# Patient Record
Sex: Male | Born: 1980 | Race: White | Hispanic: No | Marital: Single | Smoking: Current every day smoker
Health system: Southern US, Community
[De-identification: ages and names within clinical notes are randomized; demographics above are authoritative.]

## PROBLEM LIST (undated history)

## (undated) DIAGNOSIS — E7800 Pure hypercholesterolemia, unspecified: Secondary | ICD-10-CM

## (undated) DIAGNOSIS — G43909 Migraine, unspecified, not intractable, without status migrainosus: Secondary | ICD-10-CM

## (undated) DIAGNOSIS — K3184 Gastroparesis: Secondary | ICD-10-CM

## (undated) DIAGNOSIS — N186 End stage renal disease: Secondary | ICD-10-CM

## (undated) DIAGNOSIS — G629 Polyneuropathy, unspecified: Secondary | ICD-10-CM

## (undated) DIAGNOSIS — I10 Essential (primary) hypertension: Secondary | ICD-10-CM

## (undated) DIAGNOSIS — D649 Anemia, unspecified: Secondary | ICD-10-CM

## (undated) HISTORY — PX: CHOLECYSTECTOMY: SHX55

## (undated) HISTORY — PX: AV FISTULA PLACEMENT: SHX1204

---

## 2014-12-01 ENCOUNTER — Encounter (HOSPITAL_BASED_OUTPATIENT_CLINIC_OR_DEPARTMENT_OTHER): Payer: Self-pay | Admitting: *Deleted

## 2014-12-01 ENCOUNTER — Emergency Department (HOSPITAL_BASED_OUTPATIENT_CLINIC_OR_DEPARTMENT_OTHER)
Admission: EM | Admit: 2014-12-01 | Discharge: 2014-12-01 | Discharge status: Against Medical Advice | Payer: Self-pay | Attending: Emergency Medicine | Admitting: Emergency Medicine

## 2014-12-01 ENCOUNTER — Other Ambulatory Visit: Payer: Self-pay

## 2014-12-01 DIAGNOSIS — Z720 Tobacco use: Secondary | ICD-10-CM | POA: Insufficient documentation

## 2014-12-01 DIAGNOSIS — I10 Essential (primary) hypertension: Secondary | ICD-10-CM | POA: Insufficient documentation

## 2014-12-01 HISTORY — DX: Essential (primary) hypertension: I10

## 2014-12-01 LAB — BASIC METABOLIC PANEL
Anion gap: 8 (ref 5–15)
BUN: 26 mg/dL — AB (ref 6–23)
CALCIUM: 8.3 mg/dL — AB (ref 8.4–10.5)
CHLORIDE: 105 mmol/L (ref 96–112)
CO2: 24 mmol/L (ref 19–32)
Creatinine, Ser: 2.78 mg/dL — ABNORMAL HIGH (ref 0.50–1.35)
GFR calc Af Amer: 32 mL/min — ABNORMAL LOW (ref >90)
GFR calc non Af Amer: 28 mL/min — ABNORMAL LOW (ref >90)
Glucose, Bld: 116 mg/dL — ABNORMAL HIGH (ref 70–99)
Potassium: 3.5 mmol/L (ref 3.5–5.1)
SODIUM: 137 mmol/L (ref 135–145)

## 2014-12-01 NOTE — ED Notes (Signed)
States ran out of BP meds 1 month ago. Now states BP has been elevated x 2 weeks. C/o h/a x 2 weeks.No vision problems.

## 2014-12-01 NOTE — ED Notes (Signed)
Patient became very angry when MD entered room about law draw and request for urinalysis. Patient yelling and screaming at staff and [employer] at bedside with security. Spouse in tears and rationale for testing explained to spouse but patient to angry to communicate with about rationale. Patient escorted off property by police after IV removed and cardiac monitor

## 2014-12-01 NOTE — ED Provider Notes (Signed)
CSN: [id number]     Arrival date & time 12/01/14  1521 History   First MD Initiated Contact with Patient 12/01/14 1618     Chief Complaint  Patient presents with  . Hypertension     (Consider location/radiation/quality/duration/timing/severity/associated sxs/prior Treatment) HPI  Past Medical History  Diagnosis Date  . Hypertension    History reviewed. No pertinent past surgical history. No family history on file. History  Substance Use Topics  . Smoking status: Current Every Day Smoker -- 0.50 packs/day    Types: Cigarettes  . Smokeless tobacco: Not on file  . Alcohol Use: Not on file    Review of Systems    Allergies  Sulfa antibiotics  Home Medications   Prior to Admission medications   Not on File   BP 215/148 mmHg  Pulse 95  Temp(Src) 98.6 F (37 C) (Oral)  Resp 16  Ht 6\' 1"  (1.854 m)  Wt 200 lb (90.719 kg)  BMI 26.39 kg/m2  SpO2 100% Physical Exam  ED Course  Procedures (including critical care time) Labs Review Labs Reviewed  BASIC METABOLIC PANEL    Imaging Review No results found.   EKG Interpretation   Date/Time:  Saturday December 01 2014 15:37:17 EDT Ventricular Rate:  96 PR Interval:  160 QRS Duration: 88 QT Interval:  350 QTC Calculation: 442 R Axis:   52 Text Interpretation:  Normal sinus rhythm Possible Left atrial enlargement  Borderline ECG No old tracing to compare Confirmed by [facility]  MD, Kindred Reidinger  [phone]) on 12/01/2014 4:19:23 PM      MDM   Final diagnoses:  None    Went into room to evaluate patient, as I introduced myself he immediately began shouting and aggressively yelling that he "didn't come here to get poked and prodded"  He was upset about being asked to give urine sample and having IV placed.  He was aggressive and belligerent, would not stop yelling after attempts at verbal redirection.  Woman in room with him began crying.  Security was called as patient seemed violent in his anger.  Pt demanding to have IV  taken out and to leave.  Attempted to let him know that we were checking his bloodwork and urine for kidney damage.  Security escorted patient out of the ED due to his continued yelling and aggressive behavior.     Alfonzo Beers, MD 12/01/14 [phone]

## 2015-02-06 ENCOUNTER — Emergency Department (HOSPITAL_COMMUNITY)
Admission: EM | Admit: 2015-02-06 | Discharge: 2015-02-06 | Disposition: A | Discharge status: Home / Self Care | Payer: Self-pay | Attending: Emergency Medicine | Admitting: Emergency Medicine

## 2015-02-06 ENCOUNTER — Encounter (HOSPITAL_COMMUNITY): Payer: Self-pay

## 2015-02-06 ENCOUNTER — Telehealth: Payer: Self-pay

## 2015-02-06 DIAGNOSIS — R000 Tachycardia, unspecified: Secondary | ICD-10-CM | POA: Insufficient documentation

## 2015-02-06 DIAGNOSIS — R519 Headache, unspecified: Secondary | ICD-10-CM

## 2015-02-06 DIAGNOSIS — R51 Headache: Secondary | ICD-10-CM | POA: Insufficient documentation

## 2015-02-06 DIAGNOSIS — N179 Acute kidney failure, unspecified: Secondary | ICD-10-CM | POA: Insufficient documentation

## 2015-02-06 DIAGNOSIS — I10 Essential (primary) hypertension: Secondary | ICD-10-CM | POA: Insufficient documentation

## 2015-02-06 DIAGNOSIS — Z720 Tobacco use: Secondary | ICD-10-CM | POA: Insufficient documentation

## 2015-02-06 LAB — BASIC METABOLIC PANEL
Anion gap: 10 (ref 5–15)
BUN: 39 mg/dL — ABNORMAL HIGH (ref 6–20)
CO2: 23 mmol/L (ref 22–32)
Calcium: 8.9 mg/dL (ref 8.9–10.3)
Chloride: 106 mmol/L (ref 101–111)
Creatinine, Ser: 3.16 mg/dL — ABNORMAL HIGH (ref 0.61–1.24)
GFR calc Af Amer: 28 mL/min — ABNORMAL LOW (ref >60)
GFR calc non Af Amer: 24 mL/min — ABNORMAL LOW (ref >60)
Glucose, Bld: 114 mg/dL — ABNORMAL HIGH (ref 65–99)
Potassium: 3.5 mmol/L (ref 3.5–5.1)
Sodium: 139 mmol/L (ref 135–145)

## 2015-02-06 MED ORDER — ATENOLOL 50 MG PO TABS
50.0000 mg | ORAL_TABLET | Freq: Once | ORAL | Status: AC
  Administered 2015-02-06: 50 mg via ORAL
  Filled 2015-02-06: qty 1

## 2015-02-06 MED ORDER — ATENOLOL 50 MG PO TABS: 50.0000 mg | ORAL_TABLET | Freq: Every day | ORAL | Status: DC

## 2015-02-06 NOTE — ED Notes (Signed)
Patient reports that he has been having headaches x several weeks, but today's headache started at 0600. Patient states that he has been taking Atenolol, but states he has not taking the med since December 2015. Patient states slight blurred vision, sensitivity to light, N/V x 2 days.

## 2015-02-06 NOTE — ED Provider Notes (Signed)
CSN: [id number]     Arrival date & time 02/06/15  0718 History   First MD Initiated Contact with Patient 02/06/15 0732     Chief Complaint  Patient presents with  . Hypertension   HPI   34 year old male presents today with hypertension. Patient reports a significant past medical history there was diagnosed 2009, prescribed atenolol 50 mg. Patient reports he's been taking that since time of diagnosis up until December 2015. Patient reports that he was not monitoring his blood pressure while taking the medication uncertain what his baseline is. Patient reports that he was previously seen and treated in the emergency room in [place] where he was able to receive his blood pressure medication without any laboratory work. Patient reports that he was seen here in April, but was discharged from the facility escorted out by security. Patient's concern today is elevated hypertension, headache, nausea and vomiting. Patient denies any focal neurological deficits including changes in vision, taste, smell, weaknesses. Patient notes his urine has been "orange recently, reports that he's been drinking plenty of water but not producing as much urine is normal. Patient denies any chest pain shortness of breath, abdominal pain, lower extremity swelling or edema. She has a history of chronic back pain, left shoulder pain which has left him with weakness in the right extremity. He denies any changes in his baseline strength in that extremity. Patient requests blood pressure medication without laboratory work today. Patient expresses his distaste for the [facility], reports that he wants his blood pressure medication without any laboratory results, and reports that he will return next month for the same. She reports that he does not have a primary care provider and does not want one.  Past Medical History  Diagnosis Date  . Hypertension    History reviewed. No pertinent past surgical history. Family History   Problem Relation Age of Onset  . Migraines Mother    History  Substance Use Topics  . Smoking status: Current Every Day Smoker -- 0.50 packs/day    Types: Cigarettes  . Smokeless tobacco: Current User  . Alcohol Use: No    Review of Systems  All other systems reviewed and are negative.    Allergies  Sulfa antibiotics  Home Medications   Prior to Admission medications   Medication Sig Start Date End Date Taking? Authorizing Provider  atenolol (TENORMIN) 50 MG tablet Take 1 tablet (50 mg total) by mouth daily. 02/06/15   Dellis Filbert Lacey Wallman, PA-C   BP 201/132 mmHg  Pulse 71  Temp(Src) 98.7 F (37.1 C) (Oral)  Resp 16  Ht 6' (1.829 m)  Wt 210 lb (95.255 kg)  BMI 28.47 kg/m2  SpO2 98% Physical Exam  Constitutional: He is oriented to person, place, and time. He appears well-developed and well-nourished.  HENT:  Head: Normocephalic and atraumatic.  Eyes: Conjunctivae and EOM are normal. Pupils are equal, round, and reactive to light. Right eye exhibits no discharge. Left eye exhibits no discharge. No scleral icterus. Right eye exhibits normal extraocular motion and no nystagmus. Left eye exhibits normal extraocular motion and no nystagmus. Right pupil is round and reactive. Left pupil is round and reactive. Pupils are equal.  Fundoscopic exam:      The right eye shows red reflex.       The left eye shows red reflex.  Funduscopic attempted difficult exam  Neck: Trachea normal and normal range of motion. Neck supple. No JVD present. Carotid bruit is not present. No tracheal deviation present.  Cardiovascular: Regular rhythm, S1 normal, S2 normal, normal heart sounds and normal pulses.  Tachycardia present.  PMI is not displaced.   No murmur heard. Pulses:      Radial pulses are 2+ on the right side, and 2+ on the left side.       Posterior tibial pulses are 2+ on the right side, and 2+ on the left side.  No lower extremity swelling or edema  Pulmonary/Chest: Effort normal and  breath sounds normal. No stridor. He has no decreased breath sounds. He has no wheezes. He has no rhonchi. He has no rales.  Abdominal: He exhibits no abdominal bruit. There is no tenderness. There is no rigidity, no rebound, no guarding, no CVA tenderness, no tenderness at McBurney's point and negative Murphy's sign.  Neurological: He is alert and oriented to person, place, and time. Coordination normal.  Psychiatric: He has a normal mood and affect. His behavior is normal. Judgment and thought content normal.  Nursing note and vitals reviewed.   ED Course  Procedures (including critical care time) Labs Review Labs Reviewed  BASIC METABOLIC PANEL - Abnormal; Notable for the following:    Glucose, Bld 114 (*)    BUN 39 (*)    Creatinine, Ser 3.16 (*)    GFR calc non Af Amer 24 (*)    GFR calc Af Amer 28 (*)    All other components within normal limits    Imaging Review No results found.   EKG Interpretation   Date/Time:  Wednesday February 06 2015 09:03:56 EDT Ventricular Rate:  82 PR Interval:  166 QRS Duration: 103 QT Interval:  379 QTC Calculation: 443 R Axis:   13 Text Interpretation:  Sinus rhythm Consider left atrial enlargement RSR'  in V1 or V2, probably normal variant Baseline wander in lead(s) II III aVR  aVF No significant change since last tracing Confirmed by [facility]  MD,  MARTHA [phone]) on 02/06/2015 9:56:03 AM      MDM   Final diagnoses:  Essential hypertension  AKI (acute kidney injury)  Headache, unspecified headache type   Labs: BMP- significant for creatine 3.16, BUN 39, glucose 114, GFR 24  Imaging: EKG  Consults: Case Management  Therapeutics: Atenolol   Assessment: hypertension, AKI  Although the patient received the appropriate care today I feel this patient needs to be handled with caution in the event he returns to the ED. His initial presentation was concerning and could potentially be unsafe for those taking care of him. Please request  security presence in the event he returns.   Plan: Pt presents with hypertension requesting medication. Before I was aware of the patient I heard screaming and yelling from a patients room while working in the provider lounge. I went to investigate it and found patient in exam bed yelling at staff, cursing and recording the event on his phone. His mother was at the door attempting to justify his behavior and asking for [place] to "just give him his medication", and " back home he never had to have labs". I instructed nursing staff to immediate leave the room as i was unsure of their safety at that time. Due to patients erradic behavior and elevated BP it was nesseccary for me to evaluate the patient to insure this was a behavior issue and not an underlying medical condition causing this. I reviewed patients chart and found his most recent visit to the ED was in 12/01/2014 and had an elevated creatinine of 2.78; he refused care,  was verbally abusive,  and had to e escorted out by security. Nursing staff member and I entered the room to find patient anxious in exam bed. He immediately began recording the conversation even though he was aware we requested he not. Pt was coherent but continued to be difficult to address as he voiced his concerns about "his federal rights" and previous ED experiences. I was able to complete an exam and explain the importance of labs today. I explained that his previous results were abnormal; specifically kidney function. He could possibly has serious damage to his kidneys and it was imperative we trend the function. He agreed to BMP only and EKG, no urine.   Pt reported headache initially but after he was able to calm down the headache was resolving and at the time of discharge was nearly absent. He had no neurological findings or red flags.   I had numerous discussions with patient, some were recorded and some not. After labs showed worsening kidney function I explained to the patient  potentially life threatenting and disabling consequences of inadequate proper follow-up and kidney function test, the need for adequate BP control, and further evaluation. Pt reports that he will just return to the emergency room and " you will give me my mediation", and "you cannot refuse me care".  I informed the patient that although we are here if he needs emergent care his overall care would be improved by a PCP that could adeqautely manage ongoing chronic conditions. I informed him that I would only write him a 30 day supply as I could not continually provide him BP medication without evaluation and neccessary studies. Only after lengthy discussion was patient agreeable to to following up with a PCP for his hypertension. I informed him of serious signs and symptoms to monitor for and to return to the ED if they presents. Pt was satisfied with today's plan and the care he received, thanked [place] for taking care of him, and was discharged with adequate follow-up resources.       Okey Regal, PA-C 02/08/15 [place], MD 02/12/15 [phone]

## 2015-02-06 NOTE — ED Notes (Signed)
Pt refused UA, PA aware

## 2015-02-06 NOTE — Discharge Instructions (Signed)
Acute Kidney Injury Acute kidney injury is a disease in which there is sudden (acute) damage to the kidneys. The kidneys are 2 organs that lie on either side of the spine between the middle of the back and the front of the abdomen. The kidneys:  Remove wastes and extra water from the blood.   Produce important hormones. These help keep bones strong, regulate blood pressure, and help create red blood cells.   Balance the fluids and chemicals in the blood and tissues. A small amount of kidney damage may not cause problems, but a large amount of damage may make it difficult or impossible for the kidneys to work the way they should. Acute kidney injury may develop into long-lasting (chronic) kidney disease. It may also develop into a life-threatening disease called end-stage kidney disease. Acute kidney injury can get worse very quickly, so it should be treated right away. Early treatment may prevent other kidney diseases from developing.  CAUSES   A problem with blood flow to the kidneys. This may be caused by:   Blood loss.   Heart disease.   Severe burns.   Liver disease.  Direct damage to the kidneys. This may be caused by:  Some medicines.   A kidney infection.   Poisoning or consuming toxic substances.   A surgical wound.   A blow to the kidney area.   A problem with urine flow. This may be caused by:   Cancer.   Kidney stones.   An enlarged prostate. SYMPTOMS   Swelling (edema) of the legs, ankles, or feet.   Tiredness (lethargy).   Nausea or vomiting.   Confusion.   Problems with urination, such as:   Painful or burning feeling during urination.   Decreased urine production.   Frequent accidents in children who are potty trained.   Bloody urine.   Muscle twitches and cramps.   Shortness of breath.   Seizures.   Chest pain or pressure. Sometimes, no symptoms are present. DIAGNOSIS Acute kidney injury may be detected  and diagnosed by tests, including blood, urine, imaging, or kidney biopsy tests.  TREATMENT Treatment of acute kidney injury varies depending on the cause and severity of the kidney damage. In mild cases, no treatment may be needed. The kidneys may heal on their own. If acute kidney injury is more severe, your caregiver will treat the cause of the kidney damage, help the kidneys heal, and prevent complications from occurring. Severe cases may require a procedure to remove toxic wastes from the body (dialysis) or surgery to repair kidney damage. Surgery may involve:   Repair of a torn kidney.   Removal of an obstruction. Most of the time, you will need to stay overnight at the hospital.  HOME CARE INSTRUCTIONS:  Follow your prescribed diet.  Only take over-the-counter or prescription medicines as directed by your caregiver.  Do not take any new medicines (prescription, over-the-counter, or nutritional supplements) unless approved by your caregiver. Many medicines can worsen your kidney damage or need to have the dose adjusted.   Keep all follow-up appointments as directed by your caregiver.  Observe your condition to make sure you are healing as expected. SEEK IMMEDIATE MEDICAL CARE IF:  You are feeling ill or have severe pain in the back or side.   Your symptoms return or you have new symptoms.  You have any symptoms of end-stage kidney disease. These include:   Persistent itchiness.   Loss of appetite.   Headaches.   Abnormally dark  or light skin.  Numbness in the hands or feet.   Easy bruising.   Frequent hiccups.   Menstruation stops.   You have a fever.  You have increased urine production.  You have pain or bleeding when urinating. MAKE SURE YOU:   Understand these instructions.  Will watch your condition.  Will get help right away if you are not doing well or get worse Document Released: 02/16/2011 Document Revised: 11/28/2012 Document  Reviewed: 04/01/2012 [facility] Patient Information 2015 [place], [place]. This information is not intended to replace advice given to you by your health care provider. Make sure you discuss any questions you have with your health care provider.  Please read attached information. Please follow-up with resources provided for insurance and primary care provider. It's imperative that you follow up with them within the next week for reevaluation of her hypertension and acute kidney injury. Not following up for potentially cause life-threatening condition, or disability. If new or worsening signs or symptoms present please return to the emergency room for further evaluation and management. Please use medication only as directed.

## 2015-02-06 NOTE — ED Notes (Signed)
Patient was at the registration area yelling at the registration person. Writer went out and asked patient to come on in to the ED. Writer was attempting to escort patient to the room. Patient ws shining a light from his phone into the writer's eyes. Writer asked patient about the light and he continued to shine it in the writer's eyes. Writer told patient that the light was hurting writer's eyes. Patient began stating that he was recording his ED visit. Writer stated, "I don't think you can do that." Patient became very irate, yelling and screaming about a Educational psychologist. Patient was placed in room 4. Charge nurse notified of what patient said about recording his visit.

## 2015-02-06 NOTE — Progress Notes (Addendum)
[facility] ED Cm consulted by ED SW after she saw pt after she was consulted by ED staff. States pt offered resources but refused all services Pt with 2 [facility] ED visits, last visit to [facility] Pt reports not having a pleasant previous ED experience. Pt reports coming from [place] where he was allowed per pt to go to ED to get medications without lab work.   Cm present as EDP/NP/PA Merry Proud spoke with pt, his mother, ?mother's "fiance"/? "my step dad" and ED assistant director, [place], about what would be needed to done to manage his elevated bp in [place] to include pcp services and risks for medical care not managed.  During this time Cm discussed with them that [facility] EDP/PA/NP are not pcps but provided emergency/crisis services and there are community providers that can provided ongoing medical care at discounted rates lower than ED visits Mother agreed to assist the pt in cost if possible CM spoke with pt who confirms self pay [facility] resident with no pcp now staying with his mother in [place] because of her medical issues   CM discussed and provided written information for self pay pcps, discussed the importance of pcp vs EDP services for f/u care, www.needymeds.org, www.goodrx.com, discounted pharmacies and other [employer] such as [employer], affordable care act,  [place] med assist, financial assistance, self pay dental services, [place] med assist, DSS and  health department  Reviewed resources for [employer] self pay pcps like [facility], family medicine at [employer], [facility], [facility], local urgent care centers, [facility], [facility] family practice, general medical clinics, family services of the [place], [facility] urgent care plus others, medication resources, [facility] out patient pharmacies and housing Pt voiced understanding and appreciation of resources provided  Pt now willing to seek community medical services available but confirms he is not working and has no money.  Pt  apologized to CM, EDPA, Assistant ED director, CM, his mother and male in the room about his behavior His apology was accepted Throughout interaction with pt he is constantly shaking his lower extremities so much that the bed is moving.  Pt later confirms with [facility] he has "ADHD" but " I'm not taking medicine."  Pt informs cm he has tried marijuana for his ADHD and has interest for the state of [place] to allow marijuana for medicinal purposes Reports he is also researching and "growing" herbs to attempt to assist him.  Pt voiced his opinions about the state of [place] medical services, his opinions about the [facility] medical services and his disagreement with medical services nationally Pt's mother voices disagreement with the pt's opinions Cm agreed to disagree with pt opinions after he asked if CM agreed with him ED security and GDP near room for pt and staff safety Provided [facility] contact information Pt agreed to a referral Cm completed referral but states if he does not like the services or presentation he will not proceed with the referral Cm shared with him that he would have to complete a financial application, bring in items for interview with [facility] staff before being confirmed he is a [facility] candidate  Pt to be contact by [facility] clinical liason

## 2015-02-06 NOTE — ED Notes (Addendum)
Triage nurse informed charge nurse that pt is recording visit. Charge spoke with pt and explained it is against hospital policy to record. Pt stated and named a Educational psychologist that allows him to record. Charge called [employer], charge alerted Multimedia programmer.   AC at bedside talking with pt. PA, director, and charge speaking with pts family member. family reports pt is from up [place] and wants a prescription for HTN. Mother reporting pt does not want to be stuck with needles. PA tried to explain that to provide care, blood work may have to be drawn. Pt states that he will sue the hospital if he is refused care. PA explained to family member that at present pt is screaming and creating a hostile environment.Pt yelling at staff throughout conversations.   Everyone leaving room, PA will come speak with pt in a few minutes.   Security and [employer] outside room.

## 2015-02-06 NOTE — Clinical Social Work Note (Signed)
Clinical Social Work Assessment  Patient Details  Name: Samuel Webb MRN: [id number] Date of Birth: 02-24-1981  Date of referral:  02/06/15               Reason for consult:  Care Management Concerns, Community Resources                Permission sought to share information with:  Other ([facility] ) Permission granted to share information::  No  Name::     refused  Agency::  refused  Relationship::  refused  Contact Information:  refused  Housing/Transportation Living arrangements for the past 2 months:   (UTA) Source of Information:  Patient Patient Interpreter Needed:  None Criminal Activity/Legal Involvement Pertinent to Current Situation/Hospitalization:   (UTA) Significant Relationships:  Parents Lives with:   (UTA) Do you feel safe going back to the place where you live?   (UTA) Need for family participation in patient care:   ([place])  Care giving concerns:  CSW received consult for patient needing outpatient primary care providers. Patient refusing primary care providers due to lack of insurance, and refusing community resource to assist  With coverage through [facility].    Social Worker assessment / plan:  CSW met with pt along with PA, at pt bedside, with family present with pt permission.  PA introduced CSW and purpose of providing resources to patient. Patient adamantly refused any primary care resources or community resources for assitance with health coverage including [facility] and the orange card. Patient stated, "I want to be able to just come the ED to get my blood pressure medication. I need ya'll to just remember me, know that I just need my medication, give me a reflil. I dont want to give any blood or get any tests." CSW and PA attempted to provide patient with education regarding approrpraiteness for emergency room visits vs appropriate primary care doctor visits. Pt states, "I'm not going to have a  primary care doctor." Pt states, "All of this isn't because of you, its because of something in [place] at another [facility]." CSW and PA provided education regarding blood pressure medication and that the maintence of blood pressure and continued blood pressure medication is handled in the primary care setting. Patient states, "I'm just going to come back here for my refills and if you refused to give me my medications I will just make a complaint to the state again and to the medical board." CSW offered to make an appointment at [place] and wellness and to have a representative regarding the orange card call patient from [facility] and patient adamantly refused.  PA stated to patient, that patient will not continue to get prescriptions and today patient was only able to receive a 30 day prescription until he can make arrangements with primary care provider. Pt would not accept the resource information. Pt mother requested the resource list. Pt continued to threaten to call the state if patient is denied refills for blood pressure medications upon future visits to the Emergency Department. PA stated, that if patient presents with blood pressure needs he would be evaluated as patient and treated in the ed if needed however pt would not be guaranteed another refill for a maintance medication without a primary care doctor to continue to follow patient blood pressure needs and medication management.   Employment status:   Special educational needs teacher) Insurance information:  Self Pay (Medicaid Pending) PT Recommendations:  Not assessed at this time Information / Referral to community resources:  Other (Comment Required) (primary care providers and [place] card information--patient refused, pt mother accepted)  Patient/Family's Response to care:  Patient in denial of importance to maintain health and wellness checks and maintaining blood pressure issues.   Patient/Family's Understanding of and Emotional  Response to Diagnosis, Current Treatment, and Prognosis:  Pt in denial of needing primary care provider to provide continued evaluation of patient blood pressure needs. Pt angry when discussing seeing a doctor on a regular basis. Pt stated, "pointing to mother" she had to drag me here to get me here today."   Emotional Assessment Appearance:  Appears stated age Attitude/Demeanor/Rapport:  Aggressive (Verbally and/or physically), Guarded, Angry Affect (typically observed):  Agitated, Blunt Orientation:  Oriented to Self, Oriented to Place, Oriented to  Time, Oriented to Situation Alcohol / Substance use:  Not Applicable Psych involvement (Current and /or in the community):   (uta)  Discharge Needs  Concerns to be addressed:  Patient refuses services Readmission within the last 30 days:  No Current discharge risk:  Other (pt needing continued blood pressure evaluations and medications, refusing primary care services) Barriers to Discharge:  Other (pt to dc home with 30 day prescription, however no continued f/u plan at PCP)   Terin Dierolf, [place], LCSW 02/06/2015, 10:09 AM

## 2015-02-06 NOTE — Telephone Encounter (Signed)
This Case Manager received call from Fuller Mandril, ED RN CM regarding patient. She indicates patient uninsured and in need of a PCP.  Call placed to patient to discuss [facility]) and to discuss patient establishing care with [facility] PCP.  Unable to reach patient; voicemail left requesting return call.

## 2015-02-07 ENCOUNTER — Telehealth: Payer: Self-pay

## 2015-02-07 NOTE — Telephone Encounter (Signed)
Call placed once again to patient to discuss obtaining PCP. Patient uninsured and without a PCP.   Provided patient with information about [place], and patient indicates he is now agreeable to establish care with a PCP. Patient indicates he has realized that he does need follow-up after all for his HTN and CKD.  In addition, provided patient with information regarding resources available at [place], including Financial Counselor to see if he meets criteria for [employer]. Patient appreciative of information and also indicates he plans to go to [place] to determine if he meets criteria for Medicaid.  Appointment obtained for patient on 02/14/15 at 1100 with Chari Manning, NP.  Patient verbalized understanding and appreciative of call.

## 2015-02-14 ENCOUNTER — Ambulatory Visit: Payer: Self-pay | Attending: Internal Medicine | Admitting: Internal Medicine

## 2015-02-14 ENCOUNTER — Encounter: Payer: Self-pay | Admitting: Internal Medicine

## 2015-02-14 VITALS — BP 191/128 | HR 64 | Temp 98.6°F | Resp 16 | Wt 209.0 lb

## 2015-02-14 DIAGNOSIS — I10 Essential (primary) hypertension: Secondary | ICD-10-CM | POA: Insufficient documentation

## 2015-02-14 DIAGNOSIS — N184 Chronic kidney disease, stage 4 (severe): Secondary | ICD-10-CM | POA: Insufficient documentation

## 2015-02-14 DIAGNOSIS — F17200 Nicotine dependence, unspecified, uncomplicated: Secondary | ICD-10-CM

## 2015-02-14 DIAGNOSIS — Z720 Tobacco use: Secondary | ICD-10-CM | POA: Insufficient documentation

## 2015-02-14 MED ORDER — AMLODIPINE BESYLATE 10 MG PO TABS: 10.0000 mg | ORAL_TABLET | Freq: Every day | ORAL | Status: AC

## 2015-02-14 MED ORDER — ATENOLOL 50 MG PO TABS: 50.0000 mg | ORAL_TABLET | Freq: Every day | ORAL | Status: AC

## 2015-02-14 NOTE — Progress Notes (Signed)
  New patient here to discuss Hypertension.

## 2015-02-14 NOTE — Patient Instructions (Signed)
Smoking Cessation Quitting smoking is important to your health and has many advantages. However, it is not always easy to quit since nicotine is a very addictive drug. Oftentimes, people try 3 times or more before being able to quit. This document explains the best ways for you to prepare to quit smoking. Quitting takes hard work and a lot of effort, but you can do it. ADVANTAGES OF QUITTING SMOKING  You will live longer, feel better, and live better.  Your body will feel the impact of quitting smoking almost immediately.  Within 20 minutes, blood pressure decreases. Your pulse returns to its normal level.  After 8 hours, carbon monoxide levels in the blood return to normal. Your oxygen level increases.  After 24 hours, the chance of having a heart attack starts to decrease. Your breath, hair, and body stop smelling like smoke.  After 48 hours, damaged nerve endings begin to recover. Your sense of taste and smell improve.  After 72 hours, the body is virtually free of nicotine. Your bronchial tubes relax and breathing becomes easier.  After 2 to 12 weeks, lungs can hold more air. Exercise becomes easier and circulation improves.  The risk of having a heart attack, stroke, cancer, or lung disease is greatly reduced.  After 1 year, the risk of coronary heart disease is cut in half.  After 5 years, the risk of stroke falls to the same as a nonsmoker.  After 10 years, the risk of lung cancer is cut in half and the risk of other cancers decreases significantly.  After 15 years, the risk of coronary heart disease drops, usually to the level of a nonsmoker.  If you are pregnant, quitting smoking will improve your chances of having a healthy baby.  The people you live with, especially any children, will be healthier.  You will have extra money to spend on things other than cigarettes. QUESTIONS TO THINK ABOUT BEFORE ATTEMPTING TO QUIT You may want to talk about your answers with your  health care provider.  Why do you want to quit?  If you tried to quit in the past, what helped and what did not?  What will be the most difficult situations for you after you quit? How will you plan to handle them?  Who can help you through the tough times? Your family? Friends? A health care provider?  What pleasures do you get from smoking? What ways can you still get pleasure if you quit? Here are some questions to ask your health care provider:  How can you help me to be successful at quitting?  What medicine do you think would be best for me and how should I take it?  What should I do if I need more help?  What is smoking withdrawal like? How can I get information on withdrawal? GET READY  Set a quit date.  Change your environment by getting rid of all cigarettes, ashtrays, matches, and lighters in your home, car, or work. Do not let people smoke in your home.  Review your past attempts to quit. Think about what worked and what did not. GET SUPPORT AND ENCOURAGEMENT You have a better chance of being successful if you have help. You can get support in many ways.  Tell your family, friends, and coworkers that you are going to quit and need their support. Ask them not to smoke around you.  Get individual, group, or telephone counseling and support. Programs are available at local hospitals and health centers. Call   your local health department for information about programs in your area.  Spiritual beliefs and practices may help some smokers quit.  Download a "quit meter" on your computer to keep track of quit statistics, such as how long you have gone without smoking, cigarettes not smoked, and money saved.  Get a self-help book about quitting smoking and staying off tobacco. [place] yourself from urges to smoke. Talk to someone, go for a walk, or occupy your time with a task.  Change your normal routine. Take a different route to work.  Drink tea instead of coffee. Eat breakfast in a different place.  Reduce your stress. Take a hot bath, exercise, or read a book.  Plan something enjoyable to do every day. Reward yourself for not smoking.  Explore interactive web-based programs that specialize in helping you quit. GET MEDICINE AND USE IT CORRECTLY Medicines can help you stop smoking and decrease the urge to smoke. Combining medicine with the above behavioral methods and support can greatly increase your chances of successfully quitting smoking.  Nicotine replacement therapy helps deliver nicotine to your body without the negative effects and risks of smoking. Nicotine replacement therapy includes nicotine gum, lozenges, inhalers, nasal sprays, and skin patches. Some may be available over-the-counter and others require a prescription.  Antidepressant medicine helps people abstain from smoking, but how this works is unknown. This medicine is available by prescription.  Nicotinic receptor partial agonist medicine simulates the effect of nicotine in your brain. This medicine is available by prescription. Ask your health care provider for advice about which medicines to use and how to use them based on your health history. Your health care provider will tell you what side effects to look out for if you choose to be on a medicine or therapy. Carefully read the information on the package. Do not use any other product containing nicotine while using a nicotine replacement product.  RELAPSE OR DIFFICULT SITUATIONS Most relapses occur within the first 3 months after quitting. Do not be discouraged if you start smoking again. Remember, most people try several times before finally quitting. You may have symptoms of withdrawal because your body is used to nicotine. You may crave cigarettes, be irritable, feel very hungry, cough often, get headaches, or have difficulty concentrating. The withdrawal symptoms are only temporary. They are strongest  when you first quit, but they will go away within 10-14 days. To reduce the chances of relapse, try to:  Avoid drinking alcohol. Drinking lowers your chances of successfully quitting.  Reduce the amount of caffeine you consume. Once you quit smoking, the amount of caffeine in your body increases and can give you symptoms, such as a rapid heartbeat, sweating, and anxiety.  Avoid smokers because they can make you want to smoke.  Do not let weight gain distract you. Many smokers will gain weight when they quit, usually less than 10 pounds. Eat a healthy diet and stay active. You can always lose the weight gained after you quit.  Find ways to improve your mood other than smoking. FOR MORE INFORMATION  www.smokefree.gov  Document Released: 07/28/2001 Document Revised: 12/18/2013 Document Reviewed: 11/12/2011 [facility] Patient Information 2015 [place], [place]. This information is not intended to replace advice given to you by your health care provider. Make sure you discuss any questions you have with your health care provider. Chronic Kidney Disease Chronic kidney disease occurs when the kidneys are damaged over a long period. The kidneys are two organs that  lie on either side of the spine between the middle of the back and the front of the abdomen. The kidneys:   Remove wastes and extra water from the blood.   Produce important hormones. These help keep bones strong, regulate blood pressure, and help create red blood cells.   Balance the fluids and chemicals in the blood and tissues. A small amount of kidney damage may not cause problems, but a large amount of damage may make it difficult or impossible for the kidneys to work the way they should. If steps are not taken to slow down the kidney damage or stop it from getting worse, the kidneys may stop working permanently. Most of the time, chronic kidney disease does not go away. However, it can often be controlled, and those with the disease can  usually live normal lives. CAUSES  The most common causes of chronic kidney disease are diabetes and high blood pressure (hypertension). Chronic kidney disease may also be caused by:   Diseases that cause the kidneys' filters to become inflamed.   Diseases that affect the immune system.   Genetic diseases.   Medicines that damage the kidneys, such as anti-inflammatory medicines.  Poisoning or exposure to toxic substances.   A reoccurring kidney or urinary infection.   A problem with urine flow. This may be caused by:   Cancer.   Kidney stones.   An enlarged prostate in males. SIGNS AND SYMPTOMS  Because the kidney damage in chronic kidney disease occurs slowly, symptoms develop slowly and may not be obvious until the kidney damage becomes severe. A person may have a kidney disease for years without showing any symptoms. Symptoms can include:   Swelling (edema) of the legs, ankles, or feet.   Tiredness (lethargy).   Nausea or vomiting.   Confusion.   Problems with urination, such as:   Decreased urine production.   Frequent urination, especially at night.   Frequent accidents in children who are potty trained.   Muscle twitches and cramps.   Shortness of breath.  Weakness.   Persistent itchiness.   Loss of appetite.  Metallic taste in the mouth.  Trouble sleeping.  Slowed development in children.  Short stature in children. DIAGNOSIS  Chronic kidney disease may be detected and diagnosed by tests, including blood, urine, imaging, or kidney biopsy tests.  TREATMENT  Most chronic kidney diseases cannot be cured. Treatment usually involves relieving symptoms and preventing or slowing the progression of the disease. Treatment may include:   A special diet. You may need to avoid alcohol and foods thatare salty and high in potassium.   Medicines. These may:   Lower blood pressure.   Relieve anemia.   Relieve swelling.    Protect the bones. HOME CARE INSTRUCTIONS   Follow your prescribed diet.   Take medicines only as directed by your health care provider. Do not take any new medicines (prescription, over-the-counter, or nutritional supplements) unless approved by your health care provider. Many medicines can worsen your kidney damage or need to have the dose adjusted.   Quit smoking if you smoke. Talk to your health care provider about a smoking cessation program.   Keep all follow-up visits as directed by your health care provider. SEEK IMMEDIATE MEDICAL CARE IF:  Your symptoms get worse or you develop new symptoms.   You develop symptoms of end-stage kidney disease. These include:   Headaches.   Abnormally dark or light skin.   Numbness in the hands or feet.  Easy bruising.   Frequent hiccups.   Menstruation stops.   You have a fever.   You have decreased urine production.   You havepain or bleeding when urinating. MAKE SURE YOU:  Understand these instructions.  Will watch your condition.  Will get help right away if you are not doing well or get worse. FOR MORE INFORMATION   American Association of Kidney Patients: BombTimer.gl  National Kidney Foundation: www.kidney.[place]: https://mathis.com/  [facility]: www.lifeoptions.org and www.kidneyschool.org Document Released: 05/12/2008 Document Revised: 12/18/2013 Document Reviewed: 04/01/2012 [facility] Patient Information 2015 [place], [place]. This information is not intended to replace advice given to you by your health care provider. Make sure you discuss any questions you have with your health care provider.

## 2015-02-14 NOTE — Progress Notes (Signed)
Patient ID: Samuel Webb, male   DOB: [birth year], 34 y.o.   MRN: [id number]  [id number]  [id number]  DOB - [birth year]  CC:  Chief Complaint  Patient presents with  . New patient    Hypertension        HPI: Samuel Webb is a 34 y.o. male here today to establish medical care.  Patient has a past medical history of hypertension.  He recently moved from [place] and reports that he has not cared to get a PCP. He has been going to the ER for medication refills. Upon review of records patient has been very aggressive towards ER staff about wanting BP medication and finding out that he has kidney disease. He reports that if the ED staff did not escort him out with security he could have found out then that his kidney's where damaged and began care. Today he states that he is only here for management of hypertension. His BP is severely elevated today and he is refusing to take clonidine or go to the ER.   Allergies  Allergen Reactions  . Sulfa Antibiotics Hives   Past Medical History  Diagnosis Date  . Hypertension    Current Outpatient Prescriptions on File Prior to Visit  Medication Sig Dispense Refill  . atenolol (TENORMIN) 50 MG tablet Take 1 tablet (50 mg total) by mouth daily. 30 tablet 0   No current facility-administered medications on file prior to visit.   Family History  Problem Relation Age of Onset  . Migraines Mother    History   Social History  . Marital Status: Single    Spouse Name: N/A  . Number of Children: N/A  . Years of Education: N/A   Occupational History  . Not on file.   Social History Main Topics  . Smoking status: Current Every Day Smoker -- 0.50 packs/day    Types: Cigarettes  . Smokeless tobacco: Current User  . Alcohol Use: No  . Drug Use: Yes    Special: Marijuana     Comment: occasionally  . Sexual Activity: Not on file   Other Topics Concern  . Not on file   Social History Narrative    Review of Systems  Eyes: Negative  for blurred vision.  Respiratory: Negative.   Cardiovascular: Negative for chest pain, palpitations and leg swelling.  Musculoskeletal: Positive for back pain.  Neurological: Positive for headaches. Negative for dizziness and tingling.  All other systems reviewed and are negative.   Objective:   Filed Vitals:   02/14/15 1105  BP: 191/128  Pulse: 64  Temp: 98.6 F (37 C)  Resp: 16    Physical Exam  Constitutional: He is oriented to person, place, and time.  Cardiovascular: Normal rate, regular rhythm and normal heart sounds.   No murmur heard. Pulmonary/Chest: Effort normal and breath sounds normal.  Abdominal: Soft. Bowel sounds are normal.  Musculoskeletal: Normal range of motion. He exhibits no edema or tenderness.  Neurological: He is alert and oriented to person, place, and time.    No results found for: WBC, HGB, HCT, MCV, PLT Lab Results  Component Value Date   CREATININE 3.16* 02/06/2015   BUN 39* 02/06/2015   NA 139 02/06/2015   K 3.5 02/06/2015   CL 106 02/06/2015   CO2 23 02/06/2015    No results found for: HGBA1C Lipid Panel  No results found for: CHOL, TRIG, HDL, CHOLHDL, VLDL, LDLCALC     Assessment and plan:   Samuel Webb was seen today for  new patient.  Diagnoses and all orders for this visit:  Malignant hypertension Orders: -     atenolol (TENORMIN) 50 MG tablet; Take 1 tablet (50 mg total) by mouth daily. -     amLODipine (NORVASC) 10 MG tablet; Take 1 tablet (10 mg total) by mouth daily. -     Ambulatory referral to Nephrology After calming patient down I was able to explain to him how having severely elevated pressures such as his places him at risk for further kidney damage, stroke, and heart attacks. He verbalized understanding and agreed to calm down so that I may assess him. Today I will add amlodipine 10 mg daily and bring him abck in 2 weeks for a recheck. I strongly encouraged patient to take clonidine or go to the ER. I explained that he  could possible suffer a stroke with such elevated pressure today. Patient verbalized understanding but states that clonidine makes him sleepy and he needed to work today.  I once more encouraged ER before patient was discharged and he declined.  Explained signs and symptoms that should warrant immediate attention.  Patient verbalized understanding with teach back used.  Chronic kidney disease (CKD), stage IV (severe) Orders: -     Ambulatory referral to Nephrology I have explained the severity of his disease and what could result if he does not see prompt care. I have placed referral to Nephrology. In the meantime he will avoid NSAID's, get BP control, DASH diet, and quit smoking.   Tobacco use disorder  Patient has been using E cigarettes to try to quit. He has not smoked in 2 days but also reports that he has been out of money to afford cigarettes. I praised him for wanting to quit and went over numerous risk of tobacco use. He verbalized understanding and promised to try to stop soon.    Return in about 2 weeks (around 02/28/2015) for Nurse Visit-BP check and 3 mo PCP .  >50% of visit spent calming patient down and encouraging him to take medication or go to ER.    Chari Manning, NP-C [facility] [phone] 02/14/2015, 11:33 AM

## 2018-03-01 ENCOUNTER — Emergency Department (HOSPITAL_COMMUNITY): Payer: Medicare Other

## 2018-03-01 ENCOUNTER — Inpatient Hospital Stay (HOSPITAL_COMMUNITY)
Admission: EM | Admit: 2018-03-01 | Discharge: 2018-03-04 | DRG: 682 | Disposition: A | Discharge status: Home / Self Care | Payer: Medicare Other | Attending: Family Medicine | Admitting: Family Medicine

## 2018-03-01 ENCOUNTER — Encounter (HOSPITAL_COMMUNITY): Payer: Self-pay | Admitting: Emergency Medicine

## 2018-03-01 DIAGNOSIS — Z79899 Other long term (current) drug therapy: Secondary | ICD-10-CM

## 2018-03-01 DIAGNOSIS — Z882 Allergy status to sulfonamides status: Secondary | ICD-10-CM | POA: Diagnosis not present

## 2018-03-01 DIAGNOSIS — F319 Bipolar disorder, unspecified: Secondary | ICD-10-CM | POA: Diagnosis present

## 2018-03-01 DIAGNOSIS — I10 Essential (primary) hypertension: Secondary | ICD-10-CM | POA: Diagnosis not present

## 2018-03-01 DIAGNOSIS — Z9115 Patient's noncompliance with renal dialysis: Secondary | ICD-10-CM | POA: Diagnosis not present

## 2018-03-01 DIAGNOSIS — Z888 Allergy status to other drugs, medicaments and biological substances status: Secondary | ICD-10-CM | POA: Diagnosis not present

## 2018-03-01 DIAGNOSIS — Z9119 Patient's noncompliance with other medical treatment and regimen: Secondary | ICD-10-CM

## 2018-03-01 DIAGNOSIS — D649 Anemia, unspecified: Secondary | ICD-10-CM | POA: Diagnosis not present

## 2018-03-01 DIAGNOSIS — J810 Acute pulmonary edema: Secondary | ICD-10-CM

## 2018-03-01 DIAGNOSIS — I120 Hypertensive chronic kidney disease with stage 5 chronic kidney disease or end stage renal disease: Secondary | ICD-10-CM | POA: Diagnosis present

## 2018-03-01 DIAGNOSIS — F609 Personality disorder, unspecified: Secondary | ICD-10-CM | POA: Diagnosis present

## 2018-03-01 DIAGNOSIS — G4700 Insomnia, unspecified: Secondary | ICD-10-CM | POA: Diagnosis not present

## 2018-03-01 DIAGNOSIS — Z992 Dependence on renal dialysis: Secondary | ICD-10-CM | POA: Diagnosis not present

## 2018-03-01 DIAGNOSIS — Z885 Allergy status to narcotic agent status: Secondary | ICD-10-CM

## 2018-03-01 DIAGNOSIS — E875 Hyperkalemia: Secondary | ICD-10-CM | POA: Diagnosis present

## 2018-03-01 DIAGNOSIS — F17210 Nicotine dependence, cigarettes, uncomplicated: Secondary | ICD-10-CM | POA: Diagnosis present

## 2018-03-01 DIAGNOSIS — Z813 Family history of other psychoactive substance abuse and dependence: Secondary | ICD-10-CM

## 2018-03-01 DIAGNOSIS — R000 Tachycardia, unspecified: Secondary | ICD-10-CM | POA: Diagnosis present

## 2018-03-01 DIAGNOSIS — Z91410 Personal history of adult physical and sexual abuse: Secondary | ICD-10-CM | POA: Diagnosis not present

## 2018-03-01 DIAGNOSIS — F39 Unspecified mood [affective] disorder: Secondary | ICD-10-CM

## 2018-03-01 DIAGNOSIS — I161 Hypertensive emergency: Secondary | ICD-10-CM | POA: Diagnosis present

## 2018-03-01 DIAGNOSIS — F1290 Cannabis use, unspecified, uncomplicated: Secondary | ICD-10-CM | POA: Diagnosis not present

## 2018-03-01 DIAGNOSIS — N186 End stage renal disease: Secondary | ICD-10-CM | POA: Diagnosis present

## 2018-03-01 DIAGNOSIS — I160 Hypertensive urgency: Secondary | ICD-10-CM | POA: Diagnosis present

## 2018-03-01 DIAGNOSIS — D638 Anemia in other chronic diseases classified elsewhere: Secondary | ICD-10-CM | POA: Diagnosis present

## 2018-03-01 DIAGNOSIS — F419 Anxiety disorder, unspecified: Secondary | ICD-10-CM | POA: Diagnosis present

## 2018-03-01 DIAGNOSIS — E8770 Fluid overload, unspecified: Secondary | ICD-10-CM | POA: Diagnosis not present

## 2018-03-01 HISTORY — DX: End stage renal disease: N18.6

## 2018-03-01 LAB — I-STAT CHEM 8, ED
BUN: 130 mg/dL — AB (ref 6–20)
CREATININE: 17.1 mg/dL — AB (ref 0.61–1.24)
Calcium, Ion: 1.11 mmol/L — ABNORMAL LOW (ref 1.15–1.40)
Chloride: 110 mmol/L (ref 98–111)
GLUCOSE: 92 mg/dL (ref 70–99)
HCT: 20 % — ABNORMAL LOW (ref 39.0–52.0)
Hemoglobin: 6.8 g/dL — CL (ref 13.0–17.0)
Potassium: 6 mmol/L — ABNORMAL HIGH (ref 3.5–5.1)
Sodium: 136 mmol/L (ref 135–145)
TCO2: 14 mmol/L — ABNORMAL LOW (ref 22–32)

## 2018-03-01 LAB — COMPREHENSIVE METABOLIC PANEL
ALT: 12 U/L (ref 0–44)
AST: 12 U/L — ABNORMAL LOW (ref 15–41)
Albumin: 3.6 g/dL (ref 3.5–5.0)
Alkaline Phosphatase: 53 U/L (ref 38–126)
Anion gap: 22 — ABNORMAL HIGH (ref 5–15)
BUN: 127 mg/dL — ABNORMAL HIGH (ref 6–20)
CO2: 14 mmol/L — ABNORMAL LOW (ref 22–32)
Calcium: 9.4 mg/dL (ref 8.9–10.3)
Chloride: 103 mmol/L (ref 98–111)
Creatinine, Ser: 15.21 mg/dL — ABNORMAL HIGH (ref 0.61–1.24)
GFR calc Af Amer: 4 mL/min — ABNORMAL LOW (ref >60)
GFR calc non Af Amer: 3 mL/min — ABNORMAL LOW (ref >60)
Glucose, Bld: 100 mg/dL — ABNORMAL HIGH (ref 70–99)
Potassium: 5.8 mmol/L — ABNORMAL HIGH (ref 3.5–5.1)
Sodium: 139 mmol/L (ref 135–145)
Total Bilirubin: 0.7 mg/dL (ref 0.3–1.2)
Total Protein: 6.8 g/dL (ref 6.5–8.1)

## 2018-03-01 LAB — CBC WITH DIFFERENTIAL/PLATELET
Abs Immature Granulocytes: 0.1 10*3/uL (ref 0.0–0.1)
Basophils Absolute: 0.1 10*3/uL (ref 0.0–0.1)
Basophils Relative: 0 %
Eosinophils Absolute: 0.2 10*3/uL (ref 0.0–0.7)
Eosinophils Relative: 2 %
HCT: 21.7 % — ABNORMAL LOW (ref 39.0–52.0)
Hemoglobin: 6.9 g/dL — CL (ref 13.0–17.0)
Immature Granulocytes: 0 %
Lymphocytes Relative: 9 %
Lymphs Abs: 1 10*3/uL (ref 0.7–4.0)
MCH: 30.1 pg (ref 26.0–34.0)
MCHC: 31.8 g/dL (ref 30.0–36.0)
MCV: 94.8 fL (ref 78.0–100.0)
Monocytes Absolute: 0.7 10*3/uL (ref 0.1–1.0)
Monocytes Relative: 6 %
Neutro Abs: 9.4 10*3/uL — ABNORMAL HIGH (ref 1.7–7.7)
Neutrophils Relative %: 83 %
Platelets: 172 10*3/uL (ref 150–400)
RBC: 2.29 MIL/uL — ABNORMAL LOW (ref 4.22–5.81)
RDW: 14.5 % (ref 11.5–15.5)
WBC: 11.4 10*3/uL — ABNORMAL HIGH (ref 4.0–10.5)

## 2018-03-01 LAB — POC OCCULT BLOOD, ED: FECAL OCCULT BLD: NEGATIVE

## 2018-03-01 LAB — ABO/RH: ABO/RH(D): B POS

## 2018-03-01 LAB — I-STAT TROPONIN, ED: Troponin i, poc: 0.04 ng/mL (ref 0.00–0.08)

## 2018-03-01 MED ORDER — ACETAMINOPHEN 325 MG PO TABS
650.0000 mg | ORAL_TABLET | Freq: Four times a day (QID) | ORAL | Status: DC | PRN
  Administered 2018-03-02 – 2018-03-03 (×3): 650 mg via ORAL
  Filled 2018-03-01 (×3): qty 2

## 2018-03-01 MED ORDER — LABETALOL HCL 200 MG PO TABS
800.0000 mg | ORAL_TABLET | Freq: Three times a day (TID) | ORAL | Status: DC
  Administered 2018-03-02 – 2018-03-04 (×6): 800 mg via ORAL
  Filled 2018-03-01 (×6): qty 4

## 2018-03-01 MED ORDER — CLONAZEPAM 1 MG PO TABS
1.0000 mg | ORAL_TABLET | Freq: Three times a day (TID) | ORAL | Status: DC | PRN
  Administered 2018-03-02: 1 mg via ORAL
  Filled 2018-03-01: qty 1

## 2018-03-01 MED ORDER — SENNOSIDES-DOCUSATE SODIUM 8.6-50 MG PO TABS: 1.0000 | ORAL_TABLET | Freq: Every evening | ORAL | Status: DC | PRN

## 2018-03-01 MED ORDER — CLONIDINE HCL 0.2 MG PO TABS
0.3000 mg | ORAL_TABLET | Freq: Once | ORAL | Status: AC
  Administered 2018-03-01: 0.3 mg via ORAL
  Filled 2018-03-01: qty 1

## 2018-03-01 MED ORDER — FUROSEMIDE 10 MG/ML IJ SOLN
80.0000 mg | Freq: Once | INTRAMUSCULAR | Status: AC
  Administered 2018-03-01: 80 mg via INTRAVENOUS
  Filled 2018-03-01: qty 8

## 2018-03-01 MED ORDER — HYDRALAZINE HCL 20 MG/ML IJ SOLN
10.0000 mg | INTRAMUSCULAR | Status: DC | PRN
  Administered 2018-03-02 (×3): 10 mg via INTRAVENOUS
  Filled 2018-03-01 (×4): qty 1

## 2018-03-01 MED ORDER — SEVELAMER CARBONATE 800 MG PO TABS
1600.0000 mg | ORAL_TABLET | Freq: Three times a day (TID) | ORAL | Status: DC
  Administered 2018-03-02 – 2018-03-04 (×5): 1600 mg via ORAL
  Filled 2018-03-01 (×4): qty 2

## 2018-03-01 MED ORDER — QUETIAPINE FUMARATE 300 MG PO TABS
300.0000 mg | ORAL_TABLET | Freq: Every day | ORAL | Status: DC
  Administered 2018-03-02 – 2018-03-03 (×3): 300 mg via ORAL
  Filled 2018-03-01 (×3): qty 1

## 2018-03-01 MED ORDER — AMLODIPINE BESYLATE 5 MG PO TABS: 10.0000 mg | ORAL_TABLET | Freq: Once | ORAL | Status: DC

## 2018-03-01 MED ORDER — ONDANSETRON HCL 4 MG PO TABS: 4.0000 mg | ORAL_TABLET | Freq: Four times a day (QID) | ORAL | Status: DC | PRN

## 2018-03-01 MED ORDER — CLONIDINE HCL 0.2 MG PO TABS
0.3000 mg | ORAL_TABLET | Freq: Three times a day (TID) | ORAL | Status: DC
  Administered 2018-03-02 – 2018-03-04 (×6): 0.3 mg via ORAL
  Filled 2018-03-01 (×6): qty 1

## 2018-03-01 MED ORDER — SODIUM POLYSTYRENE SULFONATE 15 GM/60ML PO SUSP
30.0000 g | Freq: Once | ORAL | Status: DC
  Filled 2018-03-01: qty 120

## 2018-03-01 MED ORDER — QUETIAPINE FUMARATE 100 MG PO TABS
100.0000 mg | ORAL_TABLET | Freq: Every day | ORAL | Status: DC
  Administered 2018-03-02 – 2018-03-04 (×3): 100 mg via ORAL
  Filled 2018-03-01: qty 1
  Filled 2018-03-01 (×3): qty 4
  Filled 2018-03-01: qty 1
  Filled 2018-03-01 (×3): qty 4
  Filled 2018-03-01: qty 1

## 2018-03-01 MED ORDER — SODIUM CHLORIDE 0.9% FLUSH
3.0000 mL | Freq: Two times a day (BID) | INTRAVENOUS | Status: DC
  Administered 2018-03-01 – 2018-03-04 (×4): 3 mL via INTRAVENOUS

## 2018-03-01 MED ORDER — SODIUM CHLORIDE 0.9 % IV SOLN: 250.0000 mL | INTRAVENOUS | Status: DC | PRN

## 2018-03-01 MED ORDER — LABETALOL HCL 200 MG PO TABS
200.0000 mg | ORAL_TABLET | Freq: Once | ORAL | Status: AC
  Administered 2018-03-01: 200 mg via ORAL
  Filled 2018-03-01: qty 1

## 2018-03-01 MED ORDER — HYDRALAZINE HCL 50 MG PO TABS
100.0000 mg | ORAL_TABLET | Freq: Three times a day (TID) | ORAL | Status: DC
  Administered 2018-03-02 – 2018-03-04 (×6): 100 mg via ORAL
  Filled 2018-03-01 (×6): qty 2

## 2018-03-01 MED ORDER — HYDRALAZINE HCL 50 MG PO TABS
100.0000 mg | ORAL_TABLET | Freq: Once | ORAL | Status: AC
  Administered 2018-03-01: 100 mg via ORAL
  Filled 2018-03-01: qty 2

## 2018-03-01 MED ORDER — NIFEDIPINE ER OSMOTIC RELEASE 90 MG PO TB24
90.0000 mg | ORAL_TABLET | Freq: Two times a day (BID) | ORAL | Status: DC
  Administered 2018-03-02 – 2018-03-04 (×4): 90 mg via ORAL
  Filled 2018-03-01 (×5): qty 1

## 2018-03-01 MED ORDER — NIFEDIPINE ER OSMOTIC RELEASE 90 MG PO TB24
90.0000 mg | ORAL_TABLET | Freq: Once | ORAL | Status: AC
  Administered 2018-03-01: 90 mg via ORAL
  Filled 2018-03-01: qty 1

## 2018-03-01 MED ORDER — PANTOPRAZOLE SODIUM 40 MG PO TBEC
40.0000 mg | DELAYED_RELEASE_TABLET | Freq: Every day | ORAL | Status: DC
  Administered 2018-03-02 – 2018-03-04 (×3): 40 mg via ORAL
  Filled 2018-03-01 (×3): qty 1

## 2018-03-01 MED ORDER — SODIUM CHLORIDE 0.9% FLUSH: 3.0000 mL | INTRAVENOUS | Status: DC | PRN

## 2018-03-01 MED ORDER — SODIUM CHLORIDE 0.9 % IV SOLN
1.0000 g | Freq: Once | INTRAVENOUS | Status: AC
  Administered 2018-03-01: 1 g via INTRAVENOUS
  Filled 2018-03-01: qty 10

## 2018-03-01 MED ORDER — INSULIN ASPART 100 UNIT/ML IV SOLN
5.0000 [IU] | Freq: Once | INTRAVENOUS | Status: AC
Start: 2018-03-01 — End: 2018-03-01
  Administered 2018-03-01: 5 [IU] via INTRAVENOUS
  Filled 2018-03-01: qty 0.05

## 2018-03-01 MED ORDER — DEXTROSE 10 % IV SOLN
Freq: Once | INTRAVENOUS | Status: AC
  Administered 2018-03-01: 22:00:00 via INTRAVENOUS

## 2018-03-01 MED ORDER — FUROSEMIDE 40 MG PO TABS
40.0000 mg | ORAL_TABLET | Freq: Two times a day (BID) | ORAL | Status: DC
  Administered 2018-03-02 – 2018-03-04 (×4): 40 mg via ORAL
  Filled 2018-03-01 (×4): qty 1

## 2018-03-01 MED ORDER — ONDANSETRON HCL 4 MG/2ML IJ SOLN
4.0000 mg | Freq: Four times a day (QID) | INTRAMUSCULAR | Status: DC | PRN
  Administered 2018-03-02 (×2): 4 mg via INTRAVENOUS
  Filled 2018-03-01 (×2): qty 2

## 2018-03-01 MED ORDER — INSULIN ASPART 100 UNIT/ML ~~LOC~~ SOLN
SUBCUTANEOUS | Status: AC
  Filled 2018-03-01: qty 1

## 2018-03-01 MED ORDER — ACETAMINOPHEN 650 MG RE SUPP: 650.0000 mg | Freq: Four times a day (QID) | RECTAL | Status: DC | PRN

## 2018-03-01 MED ORDER — SODIUM CHLORIDE 0.9% FLUSH
3.0000 mL | Freq: Two times a day (BID) | INTRAVENOUS | Status: DC
  Administered 2018-03-03: 3 mL via INTRAVENOUS

## 2018-03-01 MED ORDER — CALCIUM CARBONATE ANTACID 500 MG PO CHEW
2.0000 | CHEWABLE_TABLET | Freq: Three times a day (TID) | ORAL | Status: DC
  Administered 2018-03-02: 400 mg via ORAL
  Filled 2018-03-01 (×2): qty 2

## 2018-03-01 NOTE — ED Provider Notes (Signed)
Patient placed in Quick Look pathway, seen and evaluated   Chief Complaint: Needs dialysis  HPI:   Patient presenting needed dialysis. He reports he has not had dialysis in around a week, however he is not sure because his "mind hasn't been right since the disease." He has chest pain, shortness of breath, and leg swelling. He reports he has been getting dialysis in [place] at the emergency department for a year due to issues with doctors there. He also reports some nausea this morning.   ROS: +Chest pain, shortness of breath, nausea, leg swelling, - abdominal pain  Physical Exam:   Gen: No distress  Neuro: Awake and Alert  Skin: Warm, pale   Hypertensive 234/133    Focused Exam: 3+ pitting edema bilaterally, crackles at bilateral bases, heart normal RR, abdomen soft, nontender  Initiation of care has begun. The patient has been counseled on the process, plan, and necessity for staying for the completion/evaluation, and the remainder of the medical screening examination    Caryl Ada 03/01/18 1815    Dorie Rank, MD 03/02/18 [phone]

## 2018-03-01 NOTE — ED Provider Notes (Signed)
[facility] EMERGENCY DEPARTMENT Provider Note   CSN: [id number] Arrival date & time: 03/01/18  1644     History   Chief Complaint Chief Complaint  Patient presents with  . needs dialysis    HPI Bobie Kistler is a 37 y.o. male who presents with complaint of needing dialysis. PMH significant for ESRD and goes to [facility] for dialysis due to medical non-compliance. He went there today and left due to not "following their behavior plan". He states that he has had numerous problems with various staff members at the hospital which he has felt has spiraled out of control. Today he states they were trying to "strip search" him and he refused and decided to leave and get a ride here. He was last dialyzed around a week ago. He has had gradually worsening intermittent dull achy chest pain, worsening SOB and bilateral leg swelling. He was ordered Ca gluconate in triage because of hyperkalemia (6.0) and EKG changes. His blood pressure in triage was 243/133. He states that he has had to have blood transfusions in the past. He denies blood in the stool or melena but has coughed up pinky frothy sputum at times.   HPI  Past Medical History:  Diagnosis Date  . Hypertension     There are no active problems to display for this patient.   No past surgical history on file.      Home Medications    Prior to Admission medications   Medication Sig Start Date End Date Taking? Authorizing Provider  amLODipine (NORVASC) 10 MG tablet Take 1 tablet (10 mg total) by mouth daily. 02/14/15   Lance Bosch, NP  atenolol (TENORMIN) 50 MG tablet Take 1 tablet (50 mg total) by mouth daily. 02/14/15   Lance Bosch, NP    Family History Family History  Problem Relation Age of Onset  . Migraines Mother     Social History Social History   Tobacco Use  . Smoking status: Current Every Day Smoker    Packs/day: 0.50    Types: Cigarettes  . Smokeless tobacco: Current User  Substance Use  Topics  . Alcohol use: No    Alcohol/week: 0.0 oz  . Drug use: Yes    Types: Marijuana    Comment: occasionally     Allergies   Sulfa antibiotics   Review of Systems Review of Systems  Constitutional: Negative for chills and fever.  Respiratory: Positive for shortness of breath.   Cardiovascular: Positive for chest pain, palpitations and leg swelling.  Gastrointestinal: Positive for nausea and vomiting. Negative for abdominal pain.  Psychiatric/Behavioral: Positive for behavioral problems.  All other systems reviewed and are negative.    Physical Exam Updated Vital Signs BP (!) 234/133 (BP Location: Right Arm) Comment: informed Anna-RN.   Pulse 75   Temp 98.5 F (36.9 C) (Oral)   Resp 20   Ht 6' (1.829 m)   Wt 104.3 kg (230 lb)   SpO2 99%   BMI 31.19 kg/m   Physical Exam  Constitutional: He is oriented to person, place, and time. He appears well-developed and well-nourished. No distress.  Calm, cooperative, chronically ill appearing  HENT:  Head: Normocephalic and atraumatic.  Eyes: Pupils are equal, round, and reactive to light. Conjunctivae are normal. Right eye exhibits no discharge. Left eye exhibits no discharge. No scleral icterus.  Neck: Normal range of motion.  Cardiovascular: Normal rate and regular rhythm.  Left fistula with palpable thrill  Pulmonary/Chest: Effort normal and breath  sounds normal. No respiratory distress.  Abdominal: Soft. Bowel sounds are normal. He exhibits no distension. There is no tenderness.  Musculoskeletal:  2+ peripheral edema bilaterally  Neurological: He is alert and oriented to person, place, and time.  Skin: Skin is warm and dry.  Psychiatric: He has a normal mood and affect. His behavior is normal.  Nursing note and vitals reviewed.    ED Treatments / Results  Labs (all labs ordered are listed, but only abnormal results are displayed) Labs Reviewed  COMPREHENSIVE METABOLIC PANEL - Abnormal; Notable for the following  components:      Result Value   Potassium 5.8 (*)    CO2 14 (*)    Glucose, Bld 100 (*)    BUN 127 (*)    Creatinine, Ser 15.21 (*)    AST 12 (*)    GFR calc non Af Amer 3 (*)    GFR calc Af Amer 4 (*)    Anion gap 22 (*)    All other components within normal limits  CBC WITH DIFFERENTIAL/PLATELET - Abnormal; Notable for the following components:   WBC 11.4 (*)    RBC 2.29 (*)    Hemoglobin 6.9 (*)    HCT 21.7 (*)    Neutro Abs 9.4 (*)    All other components within normal limits  I-STAT CHEM 8, ED - Abnormal; Notable for the following components:   Potassium 6.0 (*)    BUN 130 (*)    Creatinine, Ser 17.10 (*)    Calcium, Ion 1.11 (*)    TCO2 14 (*)    Hemoglobin 6.8 (*)    HCT 20.0 (*)    All other components within normal limits  I-STAT TROPONIN, ED  POC OCCULT BLOOD, ED  TYPE AND SCREEN  ABO/RH    EKG None  Radiology Dg Chest Portable 1 View  Result Date: 03/01/2018 CLINICAL DATA:  Left sided Chest pain with SOB beginning this AM. Pt. has leg swelling and is hypertensive also Pt. missed dialysis this week. EXAM: PORTABLE CHEST 1 VIEW COMPARISON:  None. FINDINGS: Central venous catheter tip: SVC. Mild enlargement of the cardiopericardial silhouette. 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 Faint Kerley B lines suggesting interstitial edema. No overt airspace edema or pleural effusion identified. 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 IMPRESSION: 1. Mild cardiomegaly and mild interstitial edema suggesting mild congestive heart failure. 2. Displaced nonunited left mid clavicular fracture. 3. Central venous catheter tip: SVC. Electronically Signed   By: Van Clines M.D.   On: 03/01/2018 19:43    Procedures Procedures (including critical care time)  CRITICAL CARE Performed by: Recardo Evangelist   Total critical care time: 35 minutes  Critical care time was exclusive of separately billable procedures  and treating other patients.  Critical care was necessary to treat or prevent imminent or life-threatening deterioration.  Critical care was time spent personally by me on the following activities: development of treatment plan with patient and/or surrogate as well as nursing, discussions with consultants, evaluation of patient's response to treatment, examination of patient, obtaining history from patient or surrogate, ordering and performing treatments and interventions, ordering and review of laboratory studies, ordering and review of radiographic studies, pulse oximetry and re-evaluation of patient's condition.   Medications Ordered in ED Medications  sodium polystyrene (KAYEXALATE) 15 GM/60ML suspension 30 g (30 g Oral Refused 03/01/18 2143)  insulin aspart (novoLOG) injection 5 Units (has no administration in time range)  dextrose 10 % infusion (  has no administration in time range)  calcium gluconate 1 g in sodium chloride 0.9 % 100 mL IVPB (1 g Intravenous New Bag/Given 03/01/18 2143)     Initial Impression / Assessment and Plan / ED Course  I have reviewed the triage vital signs and the nursing notes.  Pertinent labs & imaging results that were available during my care of the patient were reviewed by me and considered in my medical decision making (see chart for details).  37 year old male presents with missed dialysis. He is markedly hypertensive throughout his ED. Other vitals are normal and on exam he is not in acute distress but does appear volume overloaded. CBC is remarkable for mild leukocytosis (11.4), anemia (hgb 6.9). CMP is remarkable for mild hyperkalemia (5.8) and elevated BUN/SCr. Troponin is normal. EKG shows peaked T waves. He was typed and screened. CXR shows mild pulmonary edema.   Discussed with Dr. Justin Mend with nephrology who recommends kayexalte 30g, insulin/glucose, medicine admission for dialysis and transfusion tomorrow.   The patient is refusing Kayexalate because  he doesn't want to have diarrhea because he can't walk well. Hemoccult was performed which was negative for gross blood or melena. He states he has had a colonoscopy in the past which did not show any acute abnormality. Discussed with Dr. Myna Hidalgo who will come to admit.   Final Clinical Impressions(s) / ED Diagnoses   Final diagnoses:  ESRD (end stage renal disease) ([place])  Hyperkalemia  Acute pulmonary edema ([facility])  Anemia, unspecified type  Hypertensive emergency    ED Discharge Orders    None       Iris Pert 03/01/18 2259    Dorie Rank, MD 03/02/18 [phone]

## 2018-03-01 NOTE — H&P (Signed)
History and Physical    Samuel Webb JJO:[id number] DOB: 01/08/1981 DOA: 03/01/2018  PCP: Patient, No Pcp Per   Patient coming from: Home  Chief Complaint: SOB, swelling, malaise, missed dialysis   HPI: Samuel Webb is a 37 y.o. male with medical history significant for end-stage renal disease, hypertension, and anxiety, now presenting to the emergency department for evaluation of shortness of breath, swelling, and malaise.  Patient reports that he has not had dialysis in a week or more. He has been receiving HD at another hospital in the emergency department, but left there this morning prior to treatment because he did not appreciate the way someone was speaking to him there. He reports progressive swelling the bilateral lower extremities, exertional dyspnea, and general malaise, worsening over the past week. Denies fevers or chills. Reports adherence with his recommended diet and fluid restrictions.  ED Course: Upon arrival to the ED, patient is found to be afebrile, saturating well on room air, mildly tachycardic, and hypertensive to 236/129.  EKG features a sinus rhythm with peaked T waves.  Chest x-ray is notable for mild cardiomegaly and mild interstitial edema.  Chemistry panel reveals potassium of 5.8, BUN 27, and bicarbonate of 14.  CBC is notable for a hemoglobin of 6.9, down from 7.5 a year ago.  Nephrology was consulted by the ED physician and will arrange for inpatient dialysis.  Type and screen was performed and patient was treated with insulin and dextrose, Kayexalate, and IV calcium.  He was given his home antihypertensives with some improvement.  He will be admitted for ongoing evaluation and management.  Review of Systems:  All other systems reviewed and apart from HPI, are negative.  Past Medical History:  Diagnosis Date  . ESRD (end stage renal disease) ([place])   . Hypertension     History reviewed. No pertinent surgical history.   reports that he has been smoking  cigarettes.  He has been smoking about 0.50 packs per day. He uses smokeless tobacco. He reports that he has current or past drug history. Drug: Marijuana. He reports that he does not drink alcohol.  Allergies  Allergen Reactions  . Heparin Other (See Comments)    Per pt.   . Hydrocodone Nausea And Vomiting  . Hydrocodone-Acetaminophen Other (See Comments)    'Makes me feel really bad'  . Sulfa Antibiotics Hives    Family History  Problem Relation Age of Onset  . Migraines Mother      Prior to Admission medications   Medication Sig Start Date End Date Taking? Authorizing Provider  calcium carbonate (TUMS EX) 750 MG chewable tablet Chew 1 tablet by mouth 3 (three) times daily.    Yes [provider]  clonazePAM (KLONOPIN) 1 MG tablet Take 1 mg by mouth 3 (three) times daily as needed for anxiety.  02/08/18  Yes [provider]  cloNIDine (CATAPRES) 0.3 MG tablet Take 0.3 mg by mouth 3 (three) times daily. 03/25/17 03/07/18 Yes [provider]  furosemide (LASIX) 40 MG tablet Take 40 mg by mouth 2 (two) times daily. 12/24/17  Yes [provider]  hydrALAZINE (APRESOLINE) 100 MG tablet Take 100 mg by mouth 3 (three) times daily. 02/08/18  Yes [provider]  labetalol (NORMODYNE) 200 MG tablet Take 800 mg by mouth 3 (three) times daily. 03/25/17 03/07/18 Yes [provider]  NIFEdipine (PROCARDIA XL/ADALAT-CC) 90 MG 24 hr tablet Take 90 mg by mouth 2 (two) times daily. 03/25/17 03/07/18 Yes [provider]  pantoprazole (  PROTONIX) 40 MG tablet Take 40 mg by mouth daily. 01/08/18  Yes [provider]  QUEtiapine (SEROQUEL) 100 MG tablet Take 100 mg by mouth daily. 02/05/18  Yes [provider]  QUEtiapine (SEROQUEL) 300 MG tablet Take 300 mg by mouth at bedtime. 02/05/18  Yes [provider]  sevelamer carbonate (RENVELA) 800 MG tablet Take 800-1,600 mg by mouth See admin instructions. Take 2 tablets three times  daily with meals then take 1 tablet with snacks 02/05/18  Yes [provider]  amLODipine (NORVASC) 10 MG tablet Take 1 tablet (10 mg total) by mouth daily. Patient not taking: Reported on 03/01/2018 02/14/15   Lance Bosch, NP  atenolol (TENORMIN) 50 MG tablet Take 1 tablet (50 mg total) by mouth daily. Patient not taking: Reported on 03/01/2018 02/14/15   Lance Bosch, NP    Physical Exam: Vitals:   03/01/18 1852 03/01/18 2000 03/01/18 2200 03/01/18 2230  BP: (!) 235/123 (!) 236/129 (!) 218/118 (!) 226/126  Pulse: 66 65 64 64  Resp: (!) 21 (!) 22 (!) 21 16  Temp:      TempSrc:      SpO2: 100% 96% 100% 97%  Weight:      Height:          Constitutional: NAD, calm  Eyes: PERTLA, lids and conjunctivae normal ENMT: Mucous membranes are moist. Posterior pharynx clear of any exudate or lesions.   Neck: normal, supple, no masses, no thyromegaly Respiratory: Rales bilaterally, no wheezing. Normal respiratory effort.    Cardiovascular: S1 & S2 heard, regular rate and rhythm. Pretibial pitting edema bialterally. Abdomen: No distension, no tenderness, no masses palpated. Bowel sounds normal.  Musculoskeletal: no clubbing / cyanosis. No joint deformity upper and lower extremities.   Skin: no significant rashes, lesions, ulcers. Warm, dry, well-perfused. Neurologic: CN 2-12 grossly intact. Sensation intact. Strength 5/5 in all 4 limbs.  Psychiatric: Alert and oriented x 3. Calm, cooperative.     Labs on Admission: I have personally reviewed following labs and imaging studies  CBC: Recent Labs  Lab 03/01/18 1815 03/01/18 1826  WBC 11.4*  --   NEUTROABS 9.4*  --   HGB 6.9* 6.8*  HCT 21.7* 20.0*  MCV 94.8  --   PLT 172  --    Basic Metabolic Panel: Recent Labs  Lab 03/01/18 1815 03/01/18 1826  NA 139 136  K 5.8* 6.0*  CL 103 110  CO2 14*  --   GLUCOSE 100* 92  BUN 127* 130*  CREATININE 15.21* 17.10*  CALCIUM 9.4  --    GFR: Estimated Creatinine Clearance:  7.3 mL/min (A) (by C-G formula based on SCr of 17.1 mg/dL (H)). Liver Function Tests: Recent Labs  Lab 03/01/18 1815  AST 12*  ALT 12  ALKPHOS 53  BILITOT 0.7  PROT 6.8  ALBUMIN 3.6   No results for input(s): LIPASE, AMYLASE in the last 168 hours. No results for input(s): AMMONIA in the last 168 hours. Coagulation Profile: No results for input(s): INR, PROTIME in the last 168 hours. Cardiac Enzymes: No results for input(s): CKTOTAL, CKMB, CKMBINDEX, TROPONINI in the last 168 hours. BNP (last 3 results) No results for input(s): PROBNP in the last 8760 hours. HbA1C: No results for input(s): HGBA1C in the last 72 hours. CBG: No results for input(s): GLUCAP in the last 168 hours. Lipid Profile: No results for input(s): CHOL, HDL, LDLCALC, TRIG, CHOLHDL, LDLDIRECT in the last 72 hours. Thyroid Function Tests: No results for input(s): TSH,  T4TOTAL, FREET4, T3FREE, THYROIDAB in the last 72 hours. Anemia Panel: No results for input(s): VITAMINB12, FOLATE, FERRITIN, TIBC, IRON, RETICCTPCT in the last 72 hours. Urine analysis: No results found for: COLORURINE, APPEARANCEUR, LABSPEC, PHURINE, GLUCOSEU, HGBUR, BILIRUBINUR, KETONESUR, PROTEINUR, UROBILINOGEN, NITRITE, LEUKOCYTESUR Sepsis Labs: @LABRCNTIP (procalcitonin:4,lacticidven:4) )No results found for this or any previous visit (from the past 240 hour(s)).   Radiological Exams on Admission: Dg Chest Portable 1 View  Result Date: 03/01/2018 CLINICAL DATA:  Left sided Chest pain with SOB beginning this AM. Pt. has leg swelling and is hypertensive also Pt. missed dialysis this week. EXAM: PORTABLE CHEST 1 VIEW COMPARISON:  None. FINDINGS: Central venous catheter tip: SVC. Mild enlargement of the cardiopericardial silhouette. 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 Faint Kerley B lines suggesting interstitial edema. No overt airspace edema or pleural effusion identified. Displaced left mid clavicular  fracture with chronic nonunion. IMPRESSION: 1. Mild cardiomegaly and mild interstitial edema suggesting mild congestive heart failure. 2. Displaced nonunited left mid clavicular fracture. 3. Central venous catheter tip: SVC. Electronically Signed   By: Van Clines M.D.   On: 03/01/2018 19:43    EKG: Independently reviewed. Sinus rhythm, peaked T-waves.   Assessment/Plan   1. ESRD with hyperkalemia, volume overload, and hypertensive urgency  - Presents with SOB, swelling, and malaise after reportedly going a week or more without dialysis  - Found to have pulmonary edema but saturating well on rm air and speaking full sentences  - Potassium is 5.8 with peaked T-waves, treated with insulin/dextrose, calcium, and kayexalate in ED  - BP was 236/129, improved some with his home medications  - Nephrology consulting and much appreciated, will arrange inpatient HD  - Continue cardiac monitoring with serial potassium levels, SLIV and give a dose of IV Lasix (reports still urinating), continue home antihypertensives and use hydralazine IVP's prn   2. Normocytic anemia  - Hgb is 6.9 on admission, down from 7.5 last August  - Denies bleeding and FOBT is negative  - Likely secondary to ESRD, dilution in setting of missed HD with volume overload  - Type and screen performed, repeat CBC in am, anticipate improvement back to baseline with fluid-removal/HD    3. Anxiety  - Continue Seroquel and Klonopin    DVT prophylaxis: SCD's  Code Status: Full  Family Communication: Discussed with patient  Consults called: Nephrology  Admission status: Inpatient     Vianne Bulls, MD [facility] Pager [phone]  If 7PM-7AM, please contact night-coverage [email or web address] Password [facility]  03/01/2018, 11:27 PM

## 2018-03-01 NOTE — ED Notes (Signed)
CRITICAL I-STAT RESULTS   I-stat Chem-8: Hemoglobin 6.8  Results given to Tomi Bamberger, MD and Angela Nevin, [place]

## 2018-03-01 NOTE — ED Triage Notes (Signed)
Pt states he has not had dialysis in maybe a week or more, due to issues not being set up with a dialysis center due to blood pressure issues, he states he fired his nephrologist. Pt states he was told to go into the ED for dialysis. Pt is hypertensive at triage.

## 2018-03-01 NOTE — ED Provider Notes (Signed)
I was asked to look at his EKG while in the waiting room. The EKG has significantly peaked T waves.  In a patient that hypertensive and not had dialysis this concerning for critical hyperkalemia.  Also has some ST changes but likely related to T wave peaking.   Suggested bringing back from the waiting room immediately and get an i-STAT Chem-8 along with giving calcium gluconate and further work-up as indicated by history.   Merrily Pew, MD 03/01/18 [phone]

## 2018-03-01 NOTE — ED Notes (Signed)
Admitting at bedside 

## 2018-03-01 NOTE — ED Notes (Signed)
Pt given sandwich and ice chips OK per Marisa Severin. PA

## 2018-03-02 ENCOUNTER — Other Ambulatory Visit: Payer: Self-pay

## 2018-03-02 DIAGNOSIS — N186 End stage renal disease: Secondary | ICD-10-CM

## 2018-03-02 DIAGNOSIS — D649 Anemia, unspecified: Secondary | ICD-10-CM

## 2018-03-02 DIAGNOSIS — I10 Essential (primary) hypertension: Secondary | ICD-10-CM

## 2018-03-02 DIAGNOSIS — E875 Hyperkalemia: Secondary | ICD-10-CM

## 2018-03-02 DIAGNOSIS — I160 Hypertensive urgency: Secondary | ICD-10-CM

## 2018-03-02 DIAGNOSIS — E8770 Fluid overload, unspecified: Secondary | ICD-10-CM

## 2018-03-02 DIAGNOSIS — Z992 Dependence on renal dialysis: Secondary | ICD-10-CM

## 2018-03-02 LAB — BASIC METABOLIC PANEL
ANION GAP: 22 — AB (ref 5–15)
BUN: 131 mg/dL — AB (ref 6–20)
CO2: 12 mmol/L — AB (ref 22–32)
Calcium: 9.4 mg/dL (ref 8.9–10.3)
Chloride: 104 mmol/L (ref 98–111)
Creatinine, Ser: 15.37 mg/dL — ABNORMAL HIGH (ref 0.61–1.24)
GFR calc Af Amer: 4 mL/min — ABNORMAL LOW (ref >60)
GFR, EST NON AFRICAN AMERICAN: 3 mL/min — AB (ref >60)
GLUCOSE: 108 mg/dL — AB (ref 70–99)
POTASSIUM: 5.7 mmol/L — AB (ref 3.5–5.1)
Sodium: 138 mmol/L (ref 135–145)

## 2018-03-02 LAB — CBC
HEMATOCRIT: 20.2 % — AB (ref 39.0–52.0)
HEMOGLOBIN: 6.4 g/dL — AB (ref 13.0–17.0)
MCH: 30 pg (ref 26.0–34.0)
MCHC: 31.7 g/dL (ref 30.0–36.0)
MCV: 94.8 fL (ref 78.0–100.0)
Platelets: 162 10*3/uL (ref 150–400)
RBC: 2.13 MIL/uL — ABNORMAL LOW (ref 4.22–5.81)
RDW: 14.4 % (ref 11.5–15.5)
WBC: 9.8 10*3/uL (ref 4.0–10.5)

## 2018-03-02 LAB — POTASSIUM
POTASSIUM: 5.5 mmol/L — AB (ref 3.5–5.1)
POTASSIUM: 5.7 mmol/L — AB (ref 3.5–5.1)
Potassium: 5.3 mmol/L — ABNORMAL HIGH (ref 3.5–5.1)

## 2018-03-02 LAB — HIV ANTIBODY (ROUTINE TESTING W REFLEX): HIV SCREEN 4TH GENERATION: NONREACTIVE

## 2018-03-02 LAB — PREPARE RBC (CROSSMATCH)

## 2018-03-02 MED ORDER — SODIUM CHLORIDE 0.9% IV SOLUTION
Freq: Once | INTRAVENOUS | Status: AC
  Administered 2018-03-02: 18:00:00 via INTRAVENOUS

## 2018-03-02 MED ORDER — SEVELAMER CARBONATE 800 MG PO TABS: 800.0000 mg | ORAL_TABLET | Freq: Two times a day (BID) | ORAL | Status: DC | PRN

## 2018-03-02 MED ORDER — CHLORHEXIDINE GLUCONATE CLOTH 2 % EX PADS
6.0000 | MEDICATED_PAD | Freq: Every day | CUTANEOUS | Status: DC
  Administered 2018-03-03: 6 via TOPICAL

## 2018-03-02 NOTE — Progress Notes (Signed)
PROGRESS NOTE  Dashiell Franchino  IWO:[id number] DOB: Mar 12, 1981 DOA: 03/01/2018 PCP: Patient, No Pcp Per  Outpatient Specialists: No nephrologist Brief Narrative: Izeyah Deike is a 37 y.o. male with a history of ESRD, HTN, anxiety, and significant nonadherence to treatment plans who presented to the ED with acute dyspnea and leg swelling. He was Dx with ESRD in late 2017 and was initially getting HD outpatient per Dr. Catha Nottingham, though ultimately "fired" them for unclear reasons and was getting HD without steady routine in the ED at [facility]. He has been seen in the ED, sometimes admitted, and sometimes not, at [facility] 32 times in 2019 by my count. He also recounts and perseverates on perceived slights by medical staff there, particularly Dr. Brayton El. He was most recently seen there on the day of admission where the following note was filed: "Pt became upset in security and refused the behavior plan that has been set in place for him, and spoke to Dr. Levi Aland, House Supervisor, and ED charge. He states he will be leaving to go to [facility] for treatment."   On arrival to the ED he was not hypoxic though had interstitial edema on CXR, severely hypertensive (236/129) with K 5.8 and peaked T waves on ECG, and anemia with hgb 6.9. He was given insulin, dextrose, IV calcium, and declined kayexalate. Nephrology consulted, supervising HD 7/17.   Assessment & Plan: Principal Problem:   ESRD needing dialysis ([place]) Active Problems:   Hyperkalemia   Volume overload   Hypertension   Hypertensive urgency   Normocytic anemia  ESRD: With hyperkalemia and HTN urgency, dyspnea with acute pulmonary edema due to not regularly getting hemodialysis.  - HD today. Discussed with nephrology attending.  - Will need to attempt to establish care with nephrology, dialysis in [place]. Has TDC and left [facility].  - Renvela  Hyperkalemia: Refused kayexalate.  - Continue telemetry - HD prn  Anemia of chronic  disease: Denies blood loss, FOBT neg.  - Give 1u PRBCs with HD today, may help with dyspnea.  - Recheck CBC in AM  HTN: Mostly related to volume which will be treated with HD. - Continue clonidine 0.3mg  TID, hydralazine 100mg  TID, labetalol 800mg  TID, nifedipine 90mg  BID, lasix 40mg  BID (still makes some urine)  Personality disorder, history of anxiety: Uncertain exact Dx, though feel this is most likely cause of persistent altercations. Discussed at length with patient today. He seems reluctant to discuss counseling but feels medications might help calm him. If this is PD as opposed to mood/psychotic disorder, medications are unlikely to help.  - Will consult psychiatry for their input - Continued home clonazepam, seroquel qHS.   DVT prophylaxis: SCDs Code Status: Full Family Communication: None at bedside Disposition Plan: Home once stable.  Consultants:   Nephrology  Psychiatry  Procedures:   HD 7/17  Antimicrobials:  None   Subjective: Perseverative on slights by previous medical staff, states he's tape recorded providers without their knowledge or consent in the past. Report nonproductive cough and dyspnea improved when sitting straight up. No fever.   Objective: Vitals:   03/02/18 1530 03/02/18 1545 03/02/18 1600 03/02/18 1630  BP: (!) 187/91 (!) 162/77 (!) 190/93 (!) 168/89  Pulse: 82 83 86 85  Resp: (!) 24 (!) 26 20 20   Temp:  98.6 F (37 C) 98.4 F (36.9 C) 98.6 F (37 C)  TempSrc:  Oral Oral   SpO2:      Weight:      Height:  Intake/Output Summary (Last 24 hours) at 03/02/2018 1654 Last data filed at 03/02/2018 1630 Gross per 24 hour  Intake 875.12 ml  Output -  Net 875.12 ml   Filed Weights   03/02/18 0128 03/02/18 0506 03/02/18 1300  Weight: 98.2 kg (216 lb 6.4 oz) 98.2 kg (216 lb 6.4 oz) 98 kg (216 lb 0.8 oz)    Gen: 37 y.o. male in no distress  Pulm: Non-labored tachypnea on room air, 97%. Scant bibasilar crackles.  CV: Regular rate and  rhythm. No murmur, rub, or gallop. No JVD, 2+ bilateral LE pitting edema. GI: Abdomen soft, non-tender, non-distended, with normoactive bowel sounds. No organomegaly or masses felt. Ext: Warm, no deformities. Left forearm fistula +thrill, no erythema/warmth/tenderness Skin: No rashes, lesions or ulcers Neuro: Alert and oriented. No focal neurological deficits. Psych: Judgement and insight appear abnormal. Mood anxious. Behavior appropriate during encounter.    Data Reviewed: I have personally reviewed following labs and imaging studies  CBC: Recent Labs  Lab 03/01/18 1815 03/01/18 1826 03/02/18 0346  WBC 11.4*  --  9.8  NEUTROABS 9.4*  --   --   HGB 6.9* 6.8* 6.4*  HCT 21.7* 20.0* 20.2*  MCV 94.8  --  94.8  PLT 172  --  765   Basic Metabolic Panel: Recent Labs  Lab 03/01/18 1815 03/01/18 1826 03/02/18 0019 03/02/18 0346 03/02/18 0610 03/02/18 1050  NA 139 136  --  138  --   --   K 5.8* 6.0* 5.3* 5.7* 5.5* 5.7*  CL 103 110  --  104  --   --   CO2 14*  --   --  12*  --   --   GLUCOSE 100* 92  --  108*  --   --   BUN 127* 130*  --  131*  --   --   CREATININE 15.21* 17.10*  --  15.37*  --   --   CALCIUM 9.4  --   --  9.4  --   --    GFR: Estimated Creatinine Clearance: 7.9 mL/min (A) (by C-G formula based on SCr of 15.37 mg/dL (H)). Liver Function Tests: Recent Labs  Lab 03/01/18 1815  AST 12*  ALT 12  ALKPHOS 53  BILITOT 0.7  PROT 6.8  ALBUMIN 3.6   No results for input(s): LIPASE, AMYLASE in the last 168 hours. No results for input(s): AMMONIA in the last 168 hours. Coagulation Profile: No results for input(s): INR, PROTIME in the last 168 hours. Cardiac Enzymes: No results for input(s): CKTOTAL, CKMB, CKMBINDEX, TROPONINI in the last 168 hours. BNP (last 3 results) No results for input(s): PROBNP in the last 8760 hours. HbA1C: No results for input(s): HGBA1C in the last 72 hours. CBG: No results for input(s): GLUCAP in the last 168 hours. Lipid  Profile: No results for input(s): CHOL, HDL, LDLCALC, TRIG, CHOLHDL, LDLDIRECT in the last 72 hours. Thyroid Function Tests: No results for input(s): TSH, T4TOTAL, FREET4, T3FREE, THYROIDAB in the last 72 hours. Anemia Panel: No results for input(s): VITAMINB12, FOLATE, FERRITIN, TIBC, IRON, RETICCTPCT in the last 72 hours. Urine analysis: No results found for: COLORURINE, APPEARANCEUR, LABSPEC, PHURINE, GLUCOSEU, HGBUR, BILIRUBINUR, KETONESUR, PROTEINUR, UROBILINOGEN, NITRITE, LEUKOCYTESUR No results found for this or any previous visit (from the past 240 hour(s)).    Radiology Studies: Dg Chest Portable 1 View  Result Date: 03/01/2018 CLINICAL DATA:  Left sided Chest pain with SOB beginning this AM. Pt. has leg swelling and is hypertensive also Pt.  missed dialysis this week. EXAM: PORTABLE CHEST 1 VIEW COMPARISON:  None. FINDINGS: Central venous catheter tip: SVC. Mild enlargement of the cardiopericardial silhouette. 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 Faint Kerley B lines suggesting interstitial edema. No overt airspace edema or pleural effusion identified. 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 IMPRESSION: 1. Mild cardiomegaly and mild interstitial edema suggesting mild congestive heart failure. 2. Displaced nonunited left mid clavicular fracture. 3. Central venous catheter tip: SVC. Electronically Signed   By: Van Clines M.D.   On: 03/01/2018 19:43    Scheduled Meds: . sodium chloride   Intravenous Once  . calcium carbonate  2 tablet Oral TID WC  . Chlorhexidine Gluconate Cloth  6 each Topical Q0600  . cloNIDine  0.3 mg Oral TID  . furosemide  40 mg Oral BID  . hydrALAZINE  100 mg Oral TID  . labetalol  800 mg Oral TID  . NIFEdipine  90 mg Oral BID  . pantoprazole  40 mg Oral Daily  . QUEtiapine  100 mg Oral Daily  . QUEtiapine  300 mg Oral QHS  . sevelamer carbonate  1,600 mg Oral TID WC  . sodium chloride  flush  3 mL Intravenous Q12H  . sodium chloride flush  3 mL Intravenous Q12H  . sodium polystyrene  30 g Oral Once   Continuous Infusions: . sodium chloride       LOS: 1 day   Time spent: 35 minutes.  Patrecia Pour, MD [facility] [email or web address] Password [email or web address] 03/02/2018, 4:54 PM

## 2018-03-02 NOTE — Consult Note (Signed)
[facility] CONSULT NOTE    Date: 03/02/2018                  Patient Name:  Samuel Webb  MRN: [id number]  DOB: 1981-08-01  Age / Sex: 37 y.o., male         PCP: Patient, No Pcp Per                 Service Requesting Consult: Dr. Myna Hidalgo                  Reason for Consult: ESRD on HD             History of Present Illness: Patient is a 37 y.o. male with a PMHx of ESRD on HD secondary to uncontrolled hypertension and anxiety who presented to the ED with several days of shortness of breath, malaise, and weakness after missing dialysis for the past week.  Patient has been dismissed from several practices due to aggressive and abusive behavior towards staff.  Most recently he has been receiving HD at [facility] only once a week.  However, he expresses discontent with his care at outside hospital and presents to the ED today for hemodialysis due to signs and symptoms of volume overload.  On arrival to the ED was afebrile, tachycardic, hypertensive with blood pressure in 230s/120s, and tachypneic.  Chest x-ray consistent with congestion.  His K was 5.8 and EKG showed peaked T waves.  Hemoglobin was 6.9.  Medications: Outpatient medications: Medications Prior to Admission  Medication Sig Dispense Refill Last Dose  . calcium carbonate (TUMS EX) 750 MG chewable tablet Chew 1 tablet by mouth 3 (three) times daily.    03/01/2018 at Unknown time  . clonazePAM (KLONOPIN) 1 MG tablet Take 1 mg by mouth 3 (three) times daily as needed for anxiety.   0 Past Month at Unknown time  . cloNIDine (CATAPRES) 0.3 MG tablet Take 0.3 mg by mouth 3 (three) times daily.   03/01/2018 at Unknown time  . furosemide (LASIX) 40 MG tablet Take 40 mg by mouth 2 (two) times daily.   03/01/2018 at Unknown time  . hydrALAZINE (APRESOLINE) 100 MG tablet Take 100 mg by mouth 3 (three) times daily.  0 03/01/2018 at Unknown time  . labetalol (NORMODYNE) 200 MG tablet Take 800 mg by mouth 3 (three) times  daily.   03/01/2018 at 0800  . NIFEdipine (PROCARDIA XL/ADALAT-CC) 90 MG 24 hr tablet Take 90 mg by mouth 2 (two) times daily.   03/01/2018 at Unknown time  . pantoprazole (PROTONIX) 40 MG tablet Take 40 mg by mouth daily.  0 03/01/2018 at Unknown time  . QUEtiapine (SEROQUEL) 100 MG tablet Take 100 mg by mouth daily.  0 03/01/2018 at Unknown time  . QUEtiapine (SEROQUEL) 300 MG tablet Take 300 mg by mouth at bedtime.  0 02/28/2018 at Unknown time  . sevelamer carbonate (RENVELA) 800 MG tablet Take 800-1,600 mg by mouth See admin instructions. Take 2 tablets three times daily with meals then take 1 tablet with snacks  0 03/01/2018 at Unknown time  . amLODipine (NORVASC) 10 MG tablet Take 1 tablet (10 mg total) by mouth daily. (Patient not taking: Reported on 03/01/2018) 90 tablet 3 Not Taking at Unknown time  . atenolol (TENORMIN) 50 MG tablet Take 1 tablet (50 mg total) by mouth daily. (Patient not taking: Reported on 03/01/2018) 30 tablet 4 Not Taking at Unknown time    Current medications: Current Facility-Administered Medications  Medication Dose Route Frequency Provider Last Rate Last Dose  . 0.9 %  sodium chloride infusion (Manually program via Guardrails IV Fluids)   Intravenous Once Erling Cruz C, MD      . 0.9 %  sodium chloride infusion  250 mL Intravenous PRN Opyd, Ilene Qua, MD      . acetaminophen (TYLENOL) tablet 650 mg  650 mg Oral Q6H PRN Opyd, Ilene Qua, MD   650 mg at 03/02/18 1237   Or  . acetaminophen (TYLENOL) suppository 650 mg  650 mg Rectal Q6H PRN Opyd, Ilene Qua, MD      . calcium carbonate (TUMS - dosed in mg elemental calcium) chewable tablet 400 mg of elemental calcium  2 tablet Oral TID WC Opyd, Ilene Qua, MD      . Chlorhexidine Gluconate Cloth 2 % PADS 6 each  6 each Topical Q0600 Estanislado Emms, MD      . clonazePAM Bobbye Charleston) tablet 1 mg  1 mg Oral TID PRN Vianne Bulls, MD   1 mg at 03/02/18 0129  . cloNIDine (CATAPRES) tablet 0.3 mg  0.3 mg Oral TID Opyd, Ilene Qua, MD      . furosemide (LASIX) tablet 40 mg  40 mg Oral BID Opyd, Ilene Qua, MD      . hydrALAZINE (APRESOLINE) injection 10 mg  10 mg Intravenous Q4H PRN Opyd, Ilene Qua, MD   10 mg at 03/02/18 0858  . hydrALAZINE (APRESOLINE) tablet 100 mg  100 mg Oral TID Opyd, Ilene Qua, MD      . labetalol (NORMODYNE) tablet 800 mg  800 mg Oral TID Opyd, Ilene Qua, MD      . NIFEdipine (PROCARDIA XL/ADALAT-CC) 24 hr tablet 90 mg  90 mg Oral BID Opyd, Ilene Qua, MD      . ondansetron (ZOFRAN) tablet 4 mg  4 mg Oral Q6H PRN Opyd, Ilene Qua, MD       Or  . ondansetron (ZOFRAN) injection 4 mg  4 mg Intravenous Q6H PRN Opyd, Ilene Qua, MD   4 mg at 03/02/18 0129  . pantoprazole (PROTONIX) EC tablet 40 mg  40 mg Oral Daily Opyd, Ilene Qua, MD      . QUEtiapine (SEROQUEL) tablet 100 mg  100 mg Oral Daily Opyd, Ilene Qua, MD      . QUEtiapine (SEROQUEL) tablet 300 mg  300 mg Oral QHS Opyd, Ilene Qua, MD   300 mg at 03/02/18 0135  . senna-docusate (Senokot-S) tablet 1 tablet  1 tablet Oral QHS PRN Opyd, Ilene Qua, MD      . sevelamer carbonate (RENVELA) tablet 1,600 mg  1,600 mg Oral TID WC Opyd, Ilene Qua, MD      . sevelamer carbonate (RENVELA) tablet 800 mg  800 mg Oral BID BM PRN Opyd, Ilene Qua, MD      . sodium chloride flush (NS) 0.9 % injection 3 mL  3 mL Intravenous Q12H Opyd, Barclay S, MD      . sodium chloride flush (NS) 0.9 % injection 3 mL  3 mL Intravenous Q12H Opyd, Ilene Qua, MD   3 mL at 03/01/18 2359  . sodium chloride flush (NS) 0.9 % injection 3 mL  3 mL Intravenous PRN Opyd, Ilene Qua, MD      . sodium polystyrene (KAYEXALATE) 15 GM/60ML suspension 30 g  30 g Oral Once Opyd, Ilene Qua, MD          Allergies: Allergies  Allergen Reactions  . Heparin  Other (See Comments)    Per pt.   . Hydrocodone Nausea And Vomiting  . Hydrocodone-Acetaminophen Other (See Comments)    'Makes me feel really bad'  . Sulfa Antibiotics Hives      Past Medical History: Past Medical History:  Diagnosis  Date  . ESRD (end stage renal disease) ([place])   . Hypertension      Past Surgical History: History reviewed. No pertinent surgical history.   Family History: Family History  Problem Relation Age of Onset  . Migraines Mother      Social History: Social History   Socioeconomic History  . Marital status: Single    Spouse name: Not on file  . Number of children: Not on file  . Years of education: Not on file  . Highest education level: Not on file  Occupational History  . Not on file  Social Needs  . Financial resource strain: Not on file  . Food insecurity:    Worry: Not on file    Inability: Not on file  . Transportation needs:    Medical: Not on file    Non-medical: Not on file  Tobacco Use  . Smoking status: Current Every Day Smoker    Packs/day: 0.50    Types: Cigarettes  . Smokeless tobacco: Current User  Substance and Sexual Activity  . Alcohol use: No    Alcohol/week: 0.0 oz  . Drug use: Yes    Types: Marijuana    Comment: occasionally  . Sexual activity: Not on file  Lifestyle  . Physical activity:    Days per week: Not on file    Minutes per session: Not on file  . Stress: Not on file  Relationships  . Social connections:    Talks on phone: Not on file    Gets together: Not on file    Attends religious service: Not on file    Active member of club or organization: Not on file    Attends meetings of clubs or organizations: Not on file    Relationship status: Not on file  . Intimate partner violence:    Fear of current or ex partner: Not on file    Emotionally abused: Not on file    Physically abused: Not on file    Forced sexual activity: Not on file  Other Topics Concern  . Not on file  Social History Narrative  . Not on file     Review of Systems: As per HPI  Vital Signs: Blood pressure (!) 203/100, pulse 75, temperature 97.8 F (36.6 C), temperature source Oral, resp. rate 20, height 6' (1.829 m), weight 216 lb 0.8 oz (98 kg), SpO2  99 %.  Weight trends: Filed Weights   03/02/18 0128 03/02/18 0506 03/02/18 1300  Weight: 216 lb 6.4 oz (98.2 kg) 216 lb 6.4 oz (98.2 kg) 216 lb 0.8 oz (98 kg)    Physical Exam: General: Vital signs reviewed and noted. Well-developed, well-nourished young male who appears in mild respiratory distress; alert, appropriate and cooperative throughout examination.  Head: Normocephalic, atraumatic.  Eyes: PERRL, EOMI, No sclera icterus   Nose: Mucous membranes moist, not inflammed, nonerythematous.  Throat: Oropharynx nonerythematous, no exudate appreciated.   Neck: No deformities, masses, or tenderness noted.Supple, No carotid Bruits, no JVD.  Lungs:   Increased work of breathing noted while on supplemental oxygen.  Crackles throughout bilaterally.  Heart: RRR. S1 and S2 normal without gallop, murmur, or rubs.  Abdomen:  BS normoactive. Soft, Nondistended, non-tender.  No  masses or organomegaly.  Extremities: 1+ pretibial edema bilaterally  Neurologic: A&O X3.  Moves all 4 extremities spontaneously and purposefully.  No focal deficits noted  Skin: No visible rashes    Lab results: Basic Metabolic Panel: Recent Labs  Lab 03/01/18 1815 03/01/18 1826  03/02/18 0346 03/02/18 0610 03/02/18 1050  NA 139 136  --  138  --   --   K 5.8* 6.0*   < > 5.7* 5.5* 5.7*  CL 103 110  --  104  --   --   CO2 14*  --   --  12*  --   --   GLUCOSE 100* 92  --  108*  --   --   BUN 127* 130*  --  131*  --   --   CREATININE 15.21* 17.10*  --  15.37*  --   --   CALCIUM 9.4  --   --  9.4  --   --    < > = values in this interval not displayed.    Liver Function Tests: Recent Labs  Lab 03/01/18 1815  AST 12*  ALT 12  ALKPHOS 53  BILITOT 0.7  PROT 6.8  ALBUMIN 3.6   No results for input(s): LIPASE, AMYLASE in the last 168 hours. No results for input(s): AMMONIA in the last 168 hours.  CBC: Recent Labs  Lab 03/01/18 1815 03/01/18 1826 03/02/18 0346  WBC 11.4*  --  9.8  NEUTROABS 9.4*  --    --   HGB 6.9* 6.8* 6.4*  HCT 21.7* 20.0* 20.2*  MCV 94.8  --  94.8  PLT 172  --  162    Cardiac Enzymes: No results for input(s): CKTOTAL, CKMB, CKMBINDEX, TROPONINI in the last 168 hours.  BNP: Invalid input(s): POCBNP  CBG: No results for input(s): GLUCAP in the last 168 hours.  Microbiology: No results found for this or any previous visit.  Coagulation Studies: No results for input(s): LABPROT, INR in the last 72 hours.  Urinalysis: No results for input(s): COLORURINE, LABSPEC, PHURINE, GLUCOSEU, HGBUR, BILIRUBINUR, KETONESUR, PROTEINUR, UROBILINOGEN, NITRITE, LEUKOCYTESUR in the last 72 hours.  Invalid input(s): APPERANCEUR    Imaging: Dg Chest Portable 1 View  Result Date: 03/01/2018 CLINICAL DATA:  Left sided Chest pain with SOB beginning this AM. Pt. has leg swelling and is hypertensive also Pt. missed dialysis this week. EXAM: PORTABLE CHEST 1 VIEW COMPARISON:  None. FINDINGS: Central venous catheter tip: SVC. Mild enlargement of the cardiopericardial silhouette. 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 Faint Kerley B lines suggesting interstitial edema. No overt airspace edema or pleural effusion identified. 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 IMPRESSION: 1. Mild cardiomegaly and mild interstitial edema suggesting mild congestive heart failure. 2. Displaced nonunited left mid clavicular fracture. 3. Central venous catheter tip: SVC. Electronically Signed   By: Van Clines M.D.   On: 03/01/2018 19:43      Assessment & Plan: Pt is a 37 y.o. yo male with a PMHX of ESRD on HD secondary to uncontrolled hypertension and anxiety who was admitted with signs and symptoms of volume overload after missing dialysis for 1 week.  1. ESRD 2/2 HTN: Has been receiving HD at [facility] after being dismissed from several practices due to aggressive behavior. Now presenting with volume overload after  missing HD x 1 week.  - Emergent HD today with UF goal 5L. May need another session tomorrow, will evaluate.  - K 5.8 with  peaked T waves on EKG. Recevied calcium gluconate, insulin, and kayexalate   - Continue to monitor electrolytes  - Lives in [place], will check if nearby nephrology practice available to help patient establish with a nephrologist   2. Uncontrolled HTN: Presented in hypertensive urgency with BP 230s/120s on arrival. Now improved after resuming some of his home medications, BP 150s-160s. Currently on clonidine 0.3 mg 3 times daily, Lasix 40 twice daily, hydralazine 100 3 times daily, labetalol 300 mg twice daily and Procardia.  He also takes amlodipine and atenolol at home which he is not on at this time. Expect this to improve after HD.   3. Anemia of CKD: Hemoglobin at 6.9 on admission which down trended to 6.4.  Given 1 unit of blood with dialysis.  4. Anxiety: On home Klonopin and Seroquel.

## 2018-03-02 NOTE — Procedures (Signed)
Tolerating hemodialysis, BP high and goal is high.  We are using a TDC. Estanislado Emms, MD

## 2018-03-03 ENCOUNTER — Encounter (HOSPITAL_COMMUNITY): Payer: Self-pay | Admitting: General Practice

## 2018-03-03 DIAGNOSIS — F1290 Cannabis use, unspecified, uncomplicated: Secondary | ICD-10-CM

## 2018-03-03 DIAGNOSIS — I161 Hypertensive emergency: Secondary | ICD-10-CM

## 2018-03-03 DIAGNOSIS — G4700 Insomnia, unspecified: Secondary | ICD-10-CM

## 2018-03-03 DIAGNOSIS — J810 Acute pulmonary edema: Secondary | ICD-10-CM

## 2018-03-03 DIAGNOSIS — F39 Unspecified mood [affective] disorder: Secondary | ICD-10-CM

## 2018-03-03 DIAGNOSIS — F17210 Nicotine dependence, cigarettes, uncomplicated: Secondary | ICD-10-CM

## 2018-03-03 DIAGNOSIS — Z813 Family history of other psychoactive substance abuse and dependence: Secondary | ICD-10-CM

## 2018-03-03 LAB — GLUCOSE, CAPILLARY: GLUCOSE-CAPILLARY: 119 mg/dL — AB (ref 70–99)

## 2018-03-03 LAB — TYPE AND SCREEN
ABO/RH(D): B POS
Antibody Screen: NEGATIVE
Unit division: 0

## 2018-03-03 LAB — BASIC METABOLIC PANEL
Anion gap: 19 — ABNORMAL HIGH (ref 5–15)
BUN: 68 mg/dL — AB (ref 6–20)
CALCIUM: 8.9 mg/dL (ref 8.9–10.3)
CO2: 21 mmol/L — ABNORMAL LOW (ref 22–32)
CREATININE: 9.33 mg/dL — AB (ref 0.61–1.24)
Chloride: 101 mmol/L (ref 98–111)
GFR calc non Af Amer: 6 mL/min — ABNORMAL LOW (ref >60)
GFR, EST AFRICAN AMERICAN: 7 mL/min — AB (ref >60)
Glucose, Bld: 96 mg/dL (ref 70–99)
Potassium: 3.6 mmol/L (ref 3.5–5.1)
SODIUM: 141 mmol/L (ref 135–145)

## 2018-03-03 LAB — CBC
HCT: 21.4 % — ABNORMAL LOW (ref 39.0–52.0)
Hemoglobin: 7.1 g/dL — ABNORMAL LOW (ref 13.0–17.0)
MCH: 29.5 pg (ref 26.0–34.0)
MCHC: 33.2 g/dL (ref 30.0–36.0)
MCV: 88.8 fL (ref 78.0–100.0)
PLATELETS: 174 10*3/uL (ref 150–400)
RBC: 2.41 MIL/uL — AB (ref 4.22–5.81)
RDW: 15.3 % (ref 11.5–15.5)
WBC: 9.1 10*3/uL (ref 4.0–10.5)

## 2018-03-03 LAB — BPAM RBC
BLOOD PRODUCT EXPIRATION DATE: 201908172359
ISSUE DATE / TIME: 201907171542
UNIT TYPE AND RH: 7300

## 2018-03-03 LAB — HEPATITIS B SURFACE ANTIGEN: HEP B S AG: NEGATIVE

## 2018-03-03 LAB — HEPATITIS B CORE ANTIBODY, TOTAL: HEP B C TOTAL AB: NEGATIVE

## 2018-03-03 MED ORDER — CHLORHEXIDINE GLUCONATE CLOTH 2 % EX PADS
6.0000 | MEDICATED_PAD | Freq: Every day | CUTANEOUS | Status: DC
  Administered 2018-03-03: 6 via TOPICAL

## 2018-03-03 NOTE — Progress Notes (Signed)
  NCM received consult: Needs number to [place] hemodialysis unit to contact to establish outpatient HD per nephrology. NCM provided pt with information:  Hospital [address],  [address]  [phone]  Whitman Hero RN,BSN,CM

## 2018-03-03 NOTE — Plan of Care (Signed)
Patient is making progress toward discharge goal, blood pressure is controlled at this time will continue to monitor and give medication as ordered

## 2018-03-03 NOTE — Progress Notes (Signed)
1. ESRD 2. HBP 3. Behavioral issues 4. Anemia  Plan HD today and I think he can be discharged.  If there is an [facility] dialysis unit we will give him the phone number and he can investigate  Subjective: Interval History: Tolerated dialysis yesterday with 4000cc removed, feels better  Objective: Vital signs in last 24 hours: Temp:  [98.1 F (36.7 C)-98.8 F (37.1 C)] 98.6 F (37 C) (07/18 8315) Pulse Rate:  [71-86] 74 (07/18 0613) Resp:  [18-27] 27 (07/18 0613) BP: (162-216)/(77-119) 178/97 (07/18 0613) SpO2:  [94 %-99 %] 98 % (07/18 0613) Weight:  [94.3 kg (207 lb 14.3 oz)-98 kg (216 lb 0.8 oz)] 94.3 kg (207 lb 14.3 oz) (07/17 1700) Weight change: -6.327 kg (-13 lb 15.2 oz)  Intake/Output from previous day: 07/17 0701 - 07/18 0700 In: 765 [Blood:765] Out: 4000  Intake/Output this shift: No intake/output data recorded.  General appearance: alert, cooperative and overall improved  Lab Results: Recent Labs    03/02/18 0346 03/03/18 0555  WBC 9.8 9.1  HGB 6.4* 7.1*  HCT 20.2* 21.4*  PLT 162 174   BMET:  Recent Labs    03/02/18 0346  03/02/18 1050 03/03/18 0555  NA 138  --   --  141  K 5.7*   < > 5.7* 3.6  CL 104  --   --  101  CO2 12*  --   --  21*  GLUCOSE 108*  --   --  96  BUN 131*  --   --  68*  CREATININE 15.37*  --   --  9.33*  CALCIUM 9.4  --   --  8.9   < > = values in this interval not displayed.   No results for input(s): PTH in the last 72 hours. Iron Studies: No results for input(s): IRON, TIBC, TRANSFERRIN, FERRITIN in the last 72 hours. Studies/Results: Dg Chest Portable 1 View  Result Date: 03/01/2018 CLINICAL DATA:  Left sided Chest pain with SOB beginning this AM. Pt. has leg swelling and is hypertensive also Pt. missed dialysis this week. EXAM: PORTABLE CHEST 1 VIEW COMPARISON:  None. FINDINGS: Central venous catheter tip: SVC. Mild enlargement of the cardiopericardial silhouette. Indistinct pulmonary vasculature compatible with  pulmonary venous hypertension. Faint Kerley B lines suggesting interstitial edema. No overt airspace edema or pleural effusion identified. 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 IMPRESSION: 1. Mild cardiomegaly and mild interstitial edema suggesting mild congestive heart failure. 2. Displaced nonunited left mid clavicular fracture. 3. Central venous catheter tip: SVC. Electronically Signed   By: Van Clines M.D.   On: 03/01/2018 19:43    Scheduled: . calcium carbonate  2 tablet Oral TID WC  . Chlorhexidine Gluconate Cloth  6 each Topical Q0600  . Chlorhexidine Gluconate Cloth  6 each Topical Q0600  . cloNIDine  0.3 mg Oral TID  . furosemide  40 mg Oral BID  . hydrALAZINE  100 mg Oral TID  . labetalol  800 mg Oral TID  . NIFEdipine  90 mg Oral BID  . pantoprazole  40 mg Oral Daily  . QUEtiapine  100 mg Oral Daily  . QUEtiapine  300 mg Oral QHS  . sevelamer carbonate  1,600 mg Oral TID WC  . sodium chloride flush  3 mL Intravenous Q12H  . sodium chloride flush  3 mL Intravenous Q12H  . sodium polystyrene  30 g Oral Once     LOS: 2 days   Estanislado Emms 03/03/2018,9:59 AM
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
## 2018-03-03 NOTE — Discharge Summary (Signed)
Physician Discharge Summary  Samuel Webb WUJ:[id number] DOB: [birth year] DOA: 03/01/2018  PCP: Patient, No Pcp Per  Admit date: 03/01/2018 Discharge date: 03/03/2018  Admitted From: Home Disposition: Home   Recommendations for Outpatient Follow-up:  1. Establish care with outpatient hemodialysis as soon as possible. Care management consulted. He is at very, very high risk of bounceback. 2. Follow up with outpatient psychiatry  Home Health: None Equipment/Devices: None Discharge Condition: Stable CODE STATUS: Full Diet recommendation: Renal  Brief/Interim Summary: Samuel Webb is a 37 y.o. male with a history of ESRD, HTN, anxiety, and significant nonadherence to treatment plans who presented to the ED with acute dyspnea and leg swelling. He was Dx with ESRD in late 2017 and was initially getting HD outpatient per Dr. Catha Nottingham, though ultimately "fired" them for unclear reasons and was getting HD without steady routine in the ED at [facility]. He has been seen in the ED, sometimes admitted, and sometimes not, at [facility] and [place] 32 times in 2019 by my count. He also recounts and perseverates on perceived slights by medical staff there, particularly Dr. Brayton El. He was most recently seen there on the day of admission where the following note was filed: "Pt became upset in security and refused the behavior plan that has been set in place for him, and spoke to Dr. Levi Aland, House Supervisor, and ED charge. He states he will be leaving to go to [facility] for treatment."   On arrival to the ED he was not hypoxic though had interstitial edema on CXR, severely hypertensive (236/129) with K 5.8 and peaked T waves on ECG, and anemia with hgb 6.9. He was given insulin, dextrose, IV calcium, and declined kayexalate. Nephrology consulted, supervised HD 7/17 and 7/18 and recommended discharge. Psychiatry consulted for behavioral disturbances affecting his health.   Discharge Diagnoses:  Principal  Problem:   ESRD needing dialysis ([place]) Active Problems:   Hyperkalemia   Volume overload   Hypertension   Hypertensive urgency   Anemia   Hypertensive emergency  ESRD: With hyperkalemia and HTN urgency, dyspnea with acute pulmonary edema due to not regularly getting hemodialysis.  - HD given 7/17 and 7/18 prior to discharge. Weight down significantly from admission 230lbs > 207lbs (pre-HD on 2/18). Nephrology recommends following up with HD in [place]. Care management has been consulted. - Has TDC and left RC AVF.  - Renvela  Hyperkalemia: Refused kayexalate. Resolved with HD.  Anemia of chronic disease: Denies blood loss, FOBT neg. Gave 1u PRBCs with HD 7/17. - Recheck CBC at follow up.  HTN: Improved with restarting home medications and volume removal (4L on 7/17) with HD. - Continue clonidine 0.3mg  TID, hydralazine 100mg  TID, labetalol 800mg  TID, nifedipine 90mg  BID, lasix 40mg  BID (still makes some urine)  History of anxiety, behavioral disorder affecting medical Tx: Uncertain exact Dx, though feel this is most likely cause of persistent altercations. Discussed at length with patient during admission. Agrees to psychiatry consultation which was performed prior to discharge.  - Psychiatry consult appreciated, note pending, but feels pt may benefit from therapy. CSW consulted. - Continued home clonazepam, seroquel qHS.   Discharge Instructions Discharge Instructions    Diet - low sodium heart healthy   Complete by:  As directed    Discharge instructions   Complete by:  As directed    You were evaluated for metabolic derangements and volume overload due to not having hemodialysis. This, of course, improved with hemodialysis and you are stable for discharge. It is critically  important that you establish care in a hemodialysis center at discharge. Care management has been consulted to assist with this. You can call [place] as well to establish care there.    If your symptoms return, seek medical attention right away.     Allergies as of 03/03/2018      Reactions   Heparin Other (See Comments)   Per pt.    Hydrocodone Nausea And Vomiting   Hydrocodone-acetaminophen Other (See Comments)   'Makes me feel really bad'   Sulfa Antibiotics Hives      Medication List    TAKE these medications   amLODipine 10 MG tablet Commonly known as:  NORVASC Take 1 tablet (10 mg total) by mouth daily.   atenolol 50 MG tablet Commonly known as:  TENORMIN Take 1 tablet (50 mg total) by mouth daily.   calcium carbonate 750 MG chewable tablet Commonly known as:  TUMS EX Chew 1 tablet by mouth 3 (three) times daily.   clonazePAM 1 MG tablet Commonly known as:  KLONOPIN Take 1 mg by mouth 3 (three) times daily as needed for anxiety.   cloNIDine 0.3 MG tablet Commonly known as:  CATAPRES Take 0.3 mg by mouth 3 (three) times daily.   furosemide 40 MG tablet Commonly known as:  LASIX Take 40 mg by mouth 2 (two) times daily.   hydrALAZINE 100 MG tablet Commonly known as:  APRESOLINE Take 100 mg by mouth 3 (three) times daily.   labetalol 200 MG tablet Commonly known as:  NORMODYNE Take 800 mg by mouth 3 (three) times daily.   NIFEdipine 90 MG 24 hr tablet Commonly known as:  PROCARDIA XL/ADALAT-CC Take 90 mg by mouth 2 (two) times daily.   pantoprazole 40 MG tablet Commonly known as:  PROTONIX Take 40 mg by mouth daily.   QUEtiapine 100 MG tablet Commonly known as:  SEROQUEL Take 100 mg by mouth daily.   QUEtiapine 300 MG tablet Commonly known as:  SEROQUEL Take 300 mg by mouth at bedtime.   sevelamer carbonate 800 MG tablet Commonly known as:  RENVELA Take 800-1,600 mg by mouth See admin instructions. Take 2 tablets three times daily with meals then take 1 tablet with snacks      Follow-up Information    [place], [place]. Call.   Why:  to establish care Contact information: [address] [place]  [address] [phone]          Allergies  Allergen Reactions  . Heparin Other (See Comments)    Per pt.   . Hydrocodone Nausea And Vomiting  . Hydrocodone-Acetaminophen Other (See Comments)    'Makes me feel really bad'  . Sulfa Antibiotics Hives    Consultations:  Nephrology  Psychiatry  Procedures/Studies: Dg Chest Portable 1 View  Result Date: 03/01/2018 CLINICAL DATA:  Left sided Chest pain with SOB beginning this AM. Pt. has leg swelling and is hypertensive also Pt. missed dialysis this week. EXAM: PORTABLE CHEST 1 VIEW COMPARISON:  None. FINDINGS: Central venous catheter tip: SVC. Mild enlargement of the cardiopericardial silhouette. 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 Faint Kerley B lines suggesting interstitial edema. No overt airspace edema or pleural effusion identified. 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 IMPRESSION: 1. Mild cardiomegaly and mild interstitial edema suggesting mild congestive heart failure. 2. Displaced nonunited left mid clavicular fracture. 3. Central venous catheter tip: SVC. Electronically Signed   By: Van Clines M.D.   On: 03/01/2018 19:43  HD 7/17, 7/18.  Subjective: Breathing much better, no chest pain. Leg swelling remains but improved. Good appetite, no N/V/D.   Discharge Exam: Vitals:   03/03/18 1359 03/03/18 1557  BP:    Pulse:    Resp:    Temp: 99 F (37.2 C) (!) 97.3 F (36.3 C)  SpO2:     General: Pt is alert, awake, not in acute distress Cardiovascular: RRR, S1/S2 +, no rubs, no gallops Respiratory: Nonlabored and clear. Abdominal: Soft, NT, ND, bowel sounds + Extremities: 1+ pitting LE edema prior to dialysis.   Labs: Basic Metabolic Panel: Recent Labs  Lab 03/01/18 1815 03/01/18 1826 03/02/18 0019 03/02/18 0346 03/02/18 0610 03/02/18 1050 03/03/18 0555  NA 139 136  --  138  --   --  141  K 5.8* 6.0* 5.3* 5.7* 5.5* 5.7* 3.6  CL 103  110  --  104  --   --  101  CO2 14*  --   --  12*  --   --  21*  GLUCOSE 100* 92  --  108*  --   --  96  BUN 127* 130*  --  131*  --   --  68*  CREATININE 15.21* 17.10*  --  15.37*  --   --  9.33*  CALCIUM 9.4  --   --  9.4  --   --  8.9   Liver Function Tests: Recent Labs  Lab 03/01/18 1815  AST 12*  ALT 12  ALKPHOS 53  BILITOT 0.7  PROT 6.8  ALBUMIN 3.6   CBC: Recent Labs  Lab 03/01/18 1815 03/01/18 1826 03/02/18 0346 03/03/18 0555  WBC 11.4*  --  9.8 9.1  NEUTROABS 9.4*  --   --   --   HGB 6.9* 6.8* 6.4* 7.1*  HCT 21.7* 20.0* 20.2* 21.4*  MCV 94.8  --  94.8 88.8  PLT 172  --  162 174   Time coordinating discharge: Approximately 40 minutes  Patrecia Pour, MD  [facility] 03/03/2018, 5:56 PM Pager [phone]

## 2018-03-04 DIAGNOSIS — F39 Unspecified mood [affective] disorder: Secondary | ICD-10-CM

## 2018-03-04 LAB — HEPATITIS B E ANTIBODY: Hep B E Ab: NEGATIVE

## 2018-03-04 NOTE — Progress Notes (Signed)
PROGRESS NOTE  Samuel Webb  KWI:[id number] DOB: 10-May-1981 DOA: 03/01/2018 PCP: Patient, No Pcp Per  Outpatient Specialists: No nephrologist Brief Narrative: Samuel Webb is a 37 y.o. male with a history of ESRD, HTN, anxiety, and significant nonadherence to treatment plans who presented to the ED with acute dyspnea and leg swelling. He was Dx with ESRD in late 2017 and was initially getting HD outpatient per Dr. Catha Nottingham, though ultimately "fired" them for unclear reasons and was getting HD without steady routine in the ED at [facility]. He has been seen in the ED, sometimes admitted, and sometimes not, at [facility] and [place] 32 times in 2019 by my count. He also recounts and perseverates on perceived slights by medical staff there, particularly Dr. Brayton El. He was most recently seen there on the day of admission where the following note was filed: "Pt became upset in security and refused the behavior plan that has been set in place for him, and spoke to Dr. Levi Aland, House Supervisor, and ED charge. He states he will be leaving to go to [facility] for treatment."   On arrival to the ED he was not hypoxic though had interstitial edema on CXR, severely hypertensive (236/129) with K 5.8 and peaked T waves on ECG, and anemia with hgb 6.9. He was given insulin, dextrose, IV calcium, and declined kayexalate. Nephrology consulted, supervising HD 7/17.   Assessment & Plan: Principal Problem:   Mood disorder ([place]) Active Problems:   ESRD needing dialysis ([place])   Hyperkalemia   Volume overload   Hypertension   Hypertensive urgency   Anemia   Hypertensive emergency  ESRD: With hyperkalemia and HTN urgency, dyspnea with acute pulmonary edema due to not regularly getting hemodialysis.  - HD today. Discussed with nephrology attending.  - Will need to attempt to establish care with nephrology, dialysis in [place]. Has TDC and left RC AVF.  - Renvela  Hyperkalemia: Refused kayexalate.  - Continue  telemetry - HD prn  Anemia of chronic disease: Denies blood loss, FOBT neg.  - Give 1u PRBCs with HD today, may help with dyspnea.  - Recheck CBC in AM  HTN: Mostly related to volume which will be treated with HD. - Continue clonidine 0.3mg  TID, hydralazine 100mg  TID, labetalol 800mg  TID, nifedipine 90mg  BID, lasix 40mg  BID (still makes some urine)  Personality disorder, history of anxiety: Uncertain exact Dx, though feel this is most likely cause of persistent altercations. Discussed at length with patient today. He seems reluctant to discuss counseling but feels medications might help calm him. If this is PD as opposed to mood/psychotic disorder, medications are unlikely to help.  - Will consult psychiatry for their input - Continued home clonazepam, seroquel qHS.   DVT prophylaxis: SCDs Code Status: Full Family Communication: None at bedside Disposition Plan: Today after dialysis  Consultants:   Nephrology  Psychiatry  Procedures:   HD 7/17  Antimicrobials:  None   Subjective: No complaints.  Patient reports he got dialysis last night however I cannot find this documented in the chart.  He reports he is going home today is waiting for ride.  Objective: Vitals:   03/03/18 2235 03/03/18 2336 03/04/18 0526 03/04/18 0843  BP: (!) 166/105 (!) 185/106 (!) 185/112 (!) 173/107  Pulse: 76 75 72   Resp: (!) 24 16 16    Temp: 98.6 F (37 C) 98.9 F (37.2 C) 99.1 F (37.3 C)   TempSrc: Oral Oral Oral   SpO2: 98% 97% 97%   Weight: 91.5  kg (201 lb 11.5 oz) 91.3 kg (201 lb 3.2 oz)    Height:        Intake/Output Summary (Last 24 hours) at 03/04/2018 0919 Last data filed at 03/04/2018 0520 Gross per 24 hour  Intake 150 ml  Output 4000 ml  Net -3850 ml   Filed Weights   03/03/18 1922 03/03/18 2235 03/03/18 2336  Weight: 95.3 kg (210 lb 1.6 oz) 91.5 kg (201 lb 11.5 oz) 91.3 kg (201 lb 3.2 oz)    Gen: 37 y.o. male in no distress  Pulm: Non-labored tachypnea on room air,  97%. Scant bibasilar crackles.  CV: Regular rate and rhythm. No murmur, rub, or gallop. No JVD, 2+ bilateral LE pitting edema. GI: Abdomen soft, non-tender, non-distended, with normoactive bowel sounds. No organomegaly or masses felt. Ext: Warm, no deformities. Left forearm fistula +thrill, no erythema/warmth/tenderness Skin: No rashes, lesions or ulcers Neuro: Alert and oriented. No focal neurological deficits. Psych: Judgement and insight appear abnormal. Mood anxious. Behavior appropriate during encounter.    Data Reviewed: I have personally reviewed following labs and imaging studies  CBC: Recent Labs  Lab 03/01/18 1815 03/01/18 1826 03/02/18 0346 03/03/18 0555  WBC 11.4*  --  9.8 9.1  NEUTROABS 9.4*  --   --   --   HGB 6.9* 6.8* 6.4* 7.1*  HCT 21.7* 20.0* 20.2* 21.4*  MCV 94.8  --  94.8 88.8  PLT 172  --  162 706   Basic Metabolic Panel: Recent Labs  Lab 03/01/18 1815 03/01/18 1826 03/02/18 0019 03/02/18 0346 03/02/18 0610 03/02/18 1050 03/03/18 0555  NA 139 136  --  138  --   --  141  K 5.8* 6.0* 5.3* 5.7* 5.5* 5.7* 3.6  CL 103 110  --  104  --   --  101  CO2 14*  --   --  12*  --   --  21*  GLUCOSE 100* 92  --  108*  --   --  96  BUN 127* 130*  --  131*  --   --  68*  CREATININE 15.21* 17.10*  --  15.37*  --   --  9.33*  CALCIUM 9.4  --   --  9.4  --   --  8.9   GFR: Estimated Creatinine Clearance: 11.8 mL/min (A) (by C-G formula based on SCr of 9.33 mg/dL (H)). Liver Function Tests: Recent Labs  Lab 03/01/18 1815  AST 12*  ALT 12  ALKPHOS 53  BILITOT 0.7  PROT 6.8  ALBUMIN 3.6   No results for input(s): LIPASE, AMYLASE in the last 168 hours. No results for input(s): AMMONIA in the last 168 hours. Coagulation Profile: No results for input(s): INR, PROTIME in the last 168 hours. Cardiac Enzymes: No results for input(s): CKTOTAL, CKMB, CKMBINDEX, TROPONINI in the last 168 hours. BNP (last 3 results) No results for input(s): PROBNP in the last 8760  hours. HbA1C: No results for input(s): HGBA1C in the last 72 hours. CBG: Recent Labs  Lab 03/03/18 2339  GLUCAP 119*   Lipid Profile: No results for input(s): CHOL, HDL, LDLCALC, TRIG, CHOLHDL, LDLDIRECT in the last 72 hours. Thyroid Function Tests: No results for input(s): TSH, T4TOTAL, FREET4, T3FREE, THYROIDAB in the last 72 hours. Anemia Panel: No results for input(s): VITAMINB12, FOLATE, FERRITIN, TIBC, IRON, RETICCTPCT in the last 72 hours. Urine analysis: No results found for: COLORURINE, APPEARANCEUR, LABSPEC, PHURINE, GLUCOSEU, HGBUR, BILIRUBINUR, KETONESUR, PROTEINUR, UROBILINOGEN, NITRITE, LEUKOCYTESUR No results found for this  or any previous visit (from the past 240 hour(s)).    Radiology Studies: No results found.  Scheduled Meds: . calcium carbonate  2 tablet Oral TID WC  . Chlorhexidine Gluconate Cloth  6 each Topical Q0600  . Chlorhexidine Gluconate Cloth  6 each Topical Q0600  . cloNIDine  0.3 mg Oral TID  . furosemide  40 mg Oral BID  . hydrALAZINE  100 mg Oral TID  . labetalol  800 mg Oral TID  . NIFEdipine  90 mg Oral BID  . pantoprazole  40 mg Oral Daily  . QUEtiapine  100 mg Oral Daily  . QUEtiapine  300 mg Oral QHS  . sevelamer carbonate  1,600 mg Oral TID WC  . sodium chloride flush  3 mL Intravenous Q12H  . sodium chloride flush  3 mL Intravenous Q12H  . sodium polystyrene  30 g Oral Once   Continuous Infusions: . sodium chloride       LOS: 3 days   Time spent: 20 minutes.  Samuel Grout, Samuel Webb [facility] [email or web address] Password [facility] 03/04/2018, 9:19 AM Patient ID: Samuel Webb, male   DOB: Apr 29, 1981, 37 y.o.   MRN: [id number]

## 2018-03-04 NOTE — Progress Notes (Signed)
Patient discharge teaching given, including activity, diet, follow-up appoints, and medications. Patient verbalized understanding of all discharge instructions. IV access was d/c'd. Vitals are stable. Skin is intact except as charted in most recent assessments. Pt to be escorted out by NT, to be driven home by family. 

## 2018-03-11 ENCOUNTER — Encounter (HOSPITAL_COMMUNITY): Payer: Self-pay | Admitting: Emergency Medicine

## 2018-03-11 ENCOUNTER — Other Ambulatory Visit: Payer: Self-pay

## 2018-03-11 ENCOUNTER — Non-Acute Institutional Stay (HOSPITAL_COMMUNITY)
Admission: EM | Admit: 2018-03-11 | Discharge: 2018-03-13 | Disposition: A | Discharge status: Home / Self Care | Payer: Medicare Other | Attending: Emergency Medicine | Admitting: Emergency Medicine

## 2018-03-11 DIAGNOSIS — I120 Hypertensive chronic kidney disease with stage 5 chronic kidney disease or end stage renal disease: Secondary | ICD-10-CM | POA: Diagnosis not present

## 2018-03-11 DIAGNOSIS — J811 Chronic pulmonary edema: Secondary | ICD-10-CM | POA: Diagnosis present

## 2018-03-11 DIAGNOSIS — E875 Hyperkalemia: Secondary | ICD-10-CM | POA: Insufficient documentation

## 2018-03-11 DIAGNOSIS — Z79899 Other long term (current) drug therapy: Secondary | ICD-10-CM | POA: Diagnosis not present

## 2018-03-11 DIAGNOSIS — N186 End stage renal disease: Secondary | ICD-10-CM | POA: Insufficient documentation

## 2018-03-11 DIAGNOSIS — D631 Anemia in chronic kidney disease: Secondary | ICD-10-CM | POA: Insufficient documentation

## 2018-03-11 DIAGNOSIS — F17210 Nicotine dependence, cigarettes, uncomplicated: Secondary | ICD-10-CM | POA: Insufficient documentation

## 2018-03-11 DIAGNOSIS — Z992 Dependence on renal dialysis: Secondary | ICD-10-CM | POA: Insufficient documentation

## 2018-03-11 DIAGNOSIS — R531 Weakness: Secondary | ICD-10-CM | POA: Diagnosis present

## 2018-03-11 DIAGNOSIS — D649 Anemia, unspecified: Secondary | ICD-10-CM

## 2018-03-11 LAB — BASIC METABOLIC PANEL
ANION GAP: 27 — AB (ref 5–15)
BUN: 157 mg/dL — ABNORMAL HIGH (ref 6–20)
CHLORIDE: 101 mmol/L (ref 98–111)
CO2: 11 mmol/L — ABNORMAL LOW (ref 22–32)
Calcium: 10.2 mg/dL (ref 8.9–10.3)
Creatinine, Ser: 21.17 mg/dL — ABNORMAL HIGH (ref 0.61–1.24)
GFR, EST AFRICAN AMERICAN: 3 mL/min — AB (ref >60)
GFR, EST NON AFRICAN AMERICAN: 2 mL/min — AB (ref >60)
Glucose, Bld: 105 mg/dL — ABNORMAL HIGH (ref 70–99)
POTASSIUM: 7 mmol/L — AB (ref 3.5–5.1)
SODIUM: 139 mmol/L (ref 135–145)

## 2018-03-11 LAB — CBC
HEMATOCRIT: 20.1 % — AB (ref 39.0–52.0)
HEMOGLOBIN: 6.4 g/dL — AB (ref 13.0–17.0)
MCH: 29.8 pg (ref 26.0–34.0)
MCHC: 31.8 g/dL (ref 30.0–36.0)
MCV: 93.5 fL (ref 78.0–100.0)
Platelets: 187 10*3/uL (ref 150–400)
RBC: 2.15 MIL/uL — AB (ref 4.22–5.81)
RDW: 15.2 % (ref 11.5–15.5)
WBC: 10.6 10*3/uL — AB (ref 4.0–10.5)

## 2018-03-11 LAB — HEPATIC FUNCTION PANEL
ALBUMIN: 3.6 g/dL (ref 3.5–5.0)
ALK PHOS: 46 U/L (ref 38–126)
ALT: 8 U/L (ref 0–44)
AST: 9 U/L — AB (ref 15–41)
BILIRUBIN TOTAL: 0.9 mg/dL (ref 0.3–1.2)
Bilirubin, Direct: 0.1 mg/dL (ref 0.0–0.2)
Indirect Bilirubin: 0.8 mg/dL (ref 0.3–0.9)
Total Protein: 6.6 g/dL (ref 6.5–8.1)

## 2018-03-11 LAB — PREPARE RBC (CROSSMATCH)

## 2018-03-11 LAB — CBG MONITORING, ED
Glucose-Capillary: 103 mg/dL — ABNORMAL HIGH (ref 70–99)
Glucose-Capillary: 175 mg/dL — ABNORMAL HIGH (ref 70–99)

## 2018-03-11 LAB — LIPASE, BLOOD: LIPASE: 73 U/L — AB (ref 11–51)

## 2018-03-11 MED ORDER — DEXTROSE 50 % IV SOLN
1.0000 | Freq: Once | INTRAVENOUS | Status: AC
  Administered 2018-03-11: 50 mL via INTRAVENOUS
  Filled 2018-03-11: qty 50

## 2018-03-11 MED ORDER — INSULIN ASPART 100 UNIT/ML IV SOLN: 10.0000 [IU] | Freq: Once | INTRAVENOUS | Status: DC

## 2018-03-11 MED ORDER — ANTICOAGULANT SODIUM CITRATE 4% (200MG/5ML) IV SOLN
5.0000 mL | Status: AC
  Administered 2018-03-11: 5 mL via INTRAVENOUS
  Filled 2018-03-11: qty 5

## 2018-03-11 MED ORDER — CHLORHEXIDINE GLUCONATE CLOTH 2 % EX PADS: 6.0000 | MEDICATED_PAD | Freq: Every day | CUTANEOUS | Status: DC

## 2018-03-11 MED ORDER — SODIUM CHLORIDE 0.9% IV SOLUTION: Freq: Once | INTRAVENOUS | Status: DC

## 2018-03-11 MED ORDER — SODIUM POLYSTYRENE SULFONATE 15 GM/60ML PO SUSP
30.0000 g | Freq: Once | ORAL | Status: DC
  Filled 2018-03-11: qty 120

## 2018-03-11 MED ORDER — INSULIN ASPART 100 UNIT/ML ~~LOC~~ SOLN
10.0000 [IU] | Freq: Once | SUBCUTANEOUS | Status: AC
  Administered 2018-03-11: 10 [IU] via SUBCUTANEOUS
  Filled 2018-03-11: qty 1

## 2018-03-11 NOTE — ED Notes (Signed)
Critical K+ level of 7.0, Mia, PA informed

## 2018-03-11 NOTE — ED Provider Notes (Signed)
[facility] EMERGENCY DEPARTMENT Provider Note   CSN: [id number] Arrival date & time: 03/11/18  1152     History   Chief Complaint Chief Complaint  Patient presents with  . Weakness    HPI Samuel Webb is a 37 y.o. male with a h/o of ESRD on HD and HTN who presents to the emergency department with a chief complaint of weakness.  Patient endorses constant, worsening generalized weakness with bilateral lower extremity edema and dyspnea over the last 8 days.  He reports that he developed a metallic taste in his mouth yesterday.  The patient has not been dialyzed since he was discharged from the hospital on July 18.  He reports that he was previously established with a nephrologist at [facility], but does not currently have a new nephrologist.  He has not followed up since he was discharged from the emergency department.  He denies chest pain, fever, chills, rash, neck pain or stiffness, vomiting, or diarrhea.  No treatment prior to arrival.  The history is provided by the patient. No language interpreter was used.    Past Medical History:  Diagnosis Date  . ESRD (end stage renal disease) ([place])   . Hypertension     Patient Active Problem List   Diagnosis Date Noted  . Pulmonary edema 03/11/2018  . Hypertensive emergency   . Mood disorder ([place])   . ESRD needing dialysis ([place]) 03/01/2018  . Hyperkalemia 03/01/2018  . Volume overload 03/01/2018  . Hypertension 03/01/2018  . Hypertensive urgency 03/01/2018  . Anemia 03/01/2018  . Acute pulmonary edema [facility])     Past Surgical History:  Procedure Laterality Date  . AV FISTULA PLACEMENT          Home Medications    Prior to Admission medications   Medication Sig Start Date End Date Taking? Authorizing Provider  amLODipine (NORVASC) 10 MG tablet Take 1 tablet (10 mg total) by mouth daily. Patient not taking: Reported on 03/01/2018 02/14/15   Lance Bosch, NP  atenolol (TENORMIN) 50 MG  tablet Take 1 tablet (50 mg total) by mouth daily. Patient not taking: Reported on 03/01/2018 02/14/15   Lance Bosch, NP  calcium carbonate (TUMS EX) 750 MG chewable tablet Chew 1 tablet by mouth 3 (three) times daily.     [provider]  clonazePAM (KLONOPIN) 1 MG tablet Take 1 mg by mouth 3 (three) times daily as needed for anxiety.  02/08/18   [provider]  cloNIDine (CATAPRES) 0.3 MG tablet Take 0.3 mg by mouth 3 (three) times daily. 03/25/17 03/07/18  [provider]  furosemide (LASIX) 40 MG tablet Take 40 mg by mouth 2 (two) times daily. 12/24/17   [provider]  hydrALAZINE (APRESOLINE) 100 MG tablet Take 100 mg by mouth 3 (three) times daily. 02/08/18   [provider]  labetalol (NORMODYNE) 200 MG tablet Take 800 mg by mouth 3 (three) times daily. 03/25/17 03/07/18  [provider]  NIFEdipine (PROCARDIA XL/ADALAT-CC) 90 MG 24 hr tablet Take 90 mg by mouth 2 (two) times daily. 03/25/17 03/07/18  [provider]  pantoprazole (PROTONIX) 40 MG tablet Take 40 mg by mouth daily. 01/08/18   [provider]  QUEtiapine (SEROQUEL) 100 MG tablet Take 100 mg by mouth daily. 02/05/18   [provider]  QUEtiapine (SEROQUEL) 300 MG tablet Take 300 mg by mouth at bedtime. 02/05/18   [provider]  sevelamer carbonate (RENVELA) 800 MG tablet Take 800-1,600 mg  by mouth See admin instructions. Take 2 tablets three times daily with meals then take 1 tablet with snacks 02/05/18   [provider]    Family History Family History  Problem Relation Age of Onset  . Migraines Mother     Social History Social History   Tobacco Use  . Smoking status: Current Every Day Smoker    Packs/day: 0.50    Types: Cigarettes  . Smokeless tobacco: Current User  Substance Use Topics  . Alcohol use: No    Alcohol/week: 0.0 oz  . Drug use: Yes    Types: Marijuana    Comment: occasionally     Allergies   Heparin;  Hydrocodone; Hydrocodone-acetaminophen; and Sulfa antibiotics   Review of Systems Review of Systems  Constitutional: Negative for appetite change, chills and fever.  HENT:       Metallic taste in mouth  Respiratory: Positive for shortness of breath.   Cardiovascular: Positive for leg swelling. Negative for chest pain.  Gastrointestinal: Negative for abdominal pain, nausea and vomiting.  Genitourinary: Negative for dysuria.  Musculoskeletal: Negative for back pain.  Skin: Negative for rash.  Allergic/Immunologic: Negative for immunocompromised state.  Neurological: Positive for weakness (generalized). Negative for headaches.  Psychiatric/Behavioral: Negative for confusion.   Physical Exam Updated Vital Signs BP (!) 161/88   Pulse 76   Temp 98.3 F (36.8 C)   Resp (!) 23   SpO2 97%   Physical Exam  Constitutional: He appears well-developed. No distress.  Chronically ill-appearing male.  HENT:  Head: Normocephalic.  Eyes: Pupils are equal, round, and reactive to light. Conjunctivae and EOM are normal. No scleral icterus.  Neck: Neck supple.  Cardiovascular: Normal rate, regular rhythm, normal heart sounds and intact distal pulses. Exam reveals no gallop and no friction rub.  No murmur heard. Pulmonary/Chest: Effort normal.  Crackles in the bilateral bases  Abdominal: Soft. He exhibits no distension.  Musculoskeletal: He exhibits no tenderness.  Edema noted to the bilateral lower extremities and bilateral hands.  Neurological: He is alert.  Skin: Skin is warm and dry. He is not diaphoretic.  Psychiatric: His behavior is normal.  Nursing note and vitals reviewed.  ED Treatments / Results  Labs (all labs ordered are listed, but only abnormal results are displayed) Labs Reviewed  BASIC METABOLIC PANEL - Abnormal; Notable for the following components:      Result Value   Potassium 7.0 (*)    CO2 11 (*)    Glucose, Bld 105 (*)    BUN 157 (*)    Creatinine, Ser 21.17 (*)     GFR calc non Af Amer 2 (*)    GFR calc Af Amer 3 (*)    Anion gap 27 (*)    All other components within normal limits  CBC - Abnormal; Notable for the following components:   WBC 10.6 (*)    RBC 2.15 (*)    Hemoglobin 6.4 (*)    HCT 20.1 (*)    All other components within normal limits  HEPATIC FUNCTION PANEL - Abnormal; Notable for the following components:   AST 9 (*)    All other components within normal limits  LIPASE, BLOOD - Abnormal; Notable for the following components:   Lipase 73 (*)    All other components within normal limits  CBG MONITORING, ED - Abnormal; Notable for the following components:   Glucose-Capillary 103 (*)    All other components within normal limits  CBG MONITORING, ED - Abnormal; Notable for  the following components:   Glucose-Capillary 175 (*)    All other components within normal limits  GLUCOSE, RANDOM  TYPE AND SCREEN  PREPARE RBC (CROSSMATCH)    EKG EKG Interpretation  Date/Time:  Friday March 11 2018 13:00:27 EDT Ventricular Rate:  66 PR Interval:    QRS Duration: 111 QT Interval:  451 QTC Calculation: 473 R Axis:   31 Text Interpretation:  Sinus rhythm Prolonged PR interval Baseline wander in lead(s) V4 less peaked t waves from prior 7/19 Confirmed by Aletta Edouard [phone]) on 03/11/2018 1:13:28 PM   Radiology No results found.  Procedures .Critical Care Performed by: Joanne Gavel, PA-C Authorized by: Joanne Gavel, PA-C   Critical care provider statement:    Critical care time (minutes):  35   Critical care time was exclusive of:  Separately billable procedures and treating other patients and teaching time   Critical care was necessary to treat or prevent imminent or life-threatening deterioration of the following conditions:  Renal failure and metabolic crisis   Critical care was time spent personally by me on the following activities:  Development of treatment plan with patient or surrogate, discussions with  consultants, evaluation of patient's response to treatment, examination of patient, obtaining history from patient or surrogate, ordering and review of laboratory studies, ordering and review of radiographic studies, pulse oximetry, re-evaluation of patient's condition, ordering and performing treatments and interventions and review of old charts   (including critical care time)  Medications Ordered in ED Medications  sodium polystyrene (KAYEXALATE) 15 GM/60ML suspension 30 g (30 g Oral Refused 03/11/18 1352)  Chlorhexidine Gluconate Cloth 2 % PADS 6 each (has no administration in time range)  0.9 %  sodium chloride infusion (Manually program via Guardrails IV Fluids) (has no administration in time range)  anticoagulant sodium citrate solution 5 mL (has no administration in time range)  dextrose 50 % solution 50 mL (50 mLs Intravenous Given 03/11/18 1339)  insulin aspart (novoLOG) injection 10 Units (10 Units Subcutaneous Given 03/11/18 1330)     Initial Impression / Assessment and Plan / ED Course  I have reviewed the triage vital signs and the nursing notes.  Pertinent labs & imaging results that were available during my care of the patient were reviewed by me and considered in my medical decision making (see chart for details).  Clinical Course as of Mar 11 1799  Fri Mar 11, 2833  3865 38 year old with end-stage renal disease here after not getting dialysis for a week.  He looks pale and washed out.  His labs are coming back and he is got continued low hemoglobin of 6.4 and his potassium is 7.0.  His EKG does show some peaking of T waves but is actually better than last week's EKG when his  potassium was not as high.  Renal is been contacted and then are setting him up for dialysis.  He may improve enough with just getting dialysis but he  possibly is going to require admission.   [MB]    Clinical Course User Index [MB] Hayden Rasmussen, MD    37 year old male with a h/o of ESRD on HD  and HTN who presents to the emergency department because he has not been dialyzed since July 18.  He has a history of noncompliance with hemodialysis. He is hypertensive.  Vitals are otherwise unremarkable.  The patient was seen and evaluated along with Dr. Melina Copa, attending physician.  He has no chest pain at this time, but potassium  is noted to be 7.0.  EKG does show peaked T waves, but improved from his previous EKG.  Chest x-ray shows mild interstitial edema and mild cardiomegaly.  Kayexalate ordered, but the patient has refused.  He was given an amp of D50 and insulin with repeat CBG checks.  On reevaluation, the patient continues to deny chest pain.  Hemoglobin 6.4, decreased from the patient's baseline.  The patient did require 1 unit of packed RBCs during his last dialysis session.  He had a negative Hemoccult during his most recent ED evaluation.  He denies melena or hematochezia today.  Spoke with Dr. Justin Mend, nephrology, given concern for missed dialysis and uremic symptoms.  We will plan for dialysis today.  I suspect with the many patients lab abnormalities that the patient may require admission following dialysis session.  Spoke with Dr. Evangeline Gula who will coordinate with Dr. Justin Mend and will accept the patient for admission if he is not able to be discharged to home after dialysis.  Final Clinical Impressions(s) / ED Diagnoses   Final diagnoses:  Hyperkalemia  ESRD (end stage renal disease) ([place])  Anemia, unspecified type    ED Discharge Orders    None       Joanne Gavel, PA-C 03/11/18 1801    Hayden Rasmussen, MD 03/12/18 1053

## 2018-03-11 NOTE — Care Management (Signed)
Pt to go to HD and receive a unit of PRBC.  Hopefully he can DC to home after that.  If not we will admit for further management.

## 2018-03-11 NOTE — ED Triage Notes (Signed)
Patient here stating he needs dialysis. 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 Last dialyzed last week, patient states is unsure of what day. States he is having difficulty finding a nephrologist in the area after some issues with his old one. Patient did not elaborate further on these issues. Patient alert, oriented, and in no apparent distress at this time.

## 2018-03-12 LAB — TYPE AND SCREEN
ABO/RH(D): B POS
Antibody Screen: NEGATIVE
UNIT DIVISION: 0
Unit division: 0

## 2018-03-12 LAB — BPAM RBC
Blood Product Expiration Date: 201908182359
Blood Product Expiration Date: 201908182359
ISSUE DATE / TIME: 201907261529
ISSUE DATE / TIME: 201907261529
UNIT TYPE AND RH: 7300
Unit Type and Rh: 7300

## 2018-03-19 ENCOUNTER — Non-Acute Institutional Stay (HOSPITAL_COMMUNITY)
Admission: EM | Admit: 2018-03-19 | Discharge: 2018-03-21 | Disposition: A | Discharge status: Home / Self Care | Payer: Medicare Other | Attending: Emergency Medicine | Admitting: Emergency Medicine

## 2018-03-19 ENCOUNTER — Other Ambulatory Visit: Payer: Self-pay

## 2018-03-19 ENCOUNTER — Emergency Department (HOSPITAL_COMMUNITY): Payer: Medicare Other

## 2018-03-19 DIAGNOSIS — E875 Hyperkalemia: Secondary | ICD-10-CM | POA: Diagnosis not present

## 2018-03-19 DIAGNOSIS — Z888 Allergy status to other drugs, medicaments and biological substances status: Secondary | ICD-10-CM | POA: Diagnosis not present

## 2018-03-19 DIAGNOSIS — Z79899 Other long term (current) drug therapy: Secondary | ICD-10-CM | POA: Insufficient documentation

## 2018-03-19 DIAGNOSIS — N186 End stage renal disease: Secondary | ICD-10-CM | POA: Diagnosis present

## 2018-03-19 DIAGNOSIS — I120 Hypertensive chronic kidney disease with stage 5 chronic kidney disease or end stage renal disease: Secondary | ICD-10-CM | POA: Insufficient documentation

## 2018-03-19 DIAGNOSIS — F17210 Nicotine dependence, cigarettes, uncomplicated: Secondary | ICD-10-CM | POA: Diagnosis not present

## 2018-03-19 DIAGNOSIS — D631 Anemia in chronic kidney disease: Secondary | ICD-10-CM | POA: Insufficient documentation

## 2018-03-19 DIAGNOSIS — Z992 Dependence on renal dialysis: Secondary | ICD-10-CM | POA: Insufficient documentation

## 2018-03-19 DIAGNOSIS — Z885 Allergy status to narcotic agent status: Secondary | ICD-10-CM | POA: Diagnosis not present

## 2018-03-19 DIAGNOSIS — E8770 Fluid overload, unspecified: Secondary | ICD-10-CM | POA: Insufficient documentation

## 2018-03-19 DIAGNOSIS — Z882 Allergy status to sulfonamides status: Secondary | ICD-10-CM | POA: Insufficient documentation

## 2018-03-19 DIAGNOSIS — R600 Localized edema: Secondary | ICD-10-CM | POA: Insufficient documentation

## 2018-03-19 DIAGNOSIS — G8929 Other chronic pain: Secondary | ICD-10-CM | POA: Insufficient documentation

## 2018-03-19 DIAGNOSIS — Z9115 Patient's noncompliance with renal dialysis: Secondary | ICD-10-CM | POA: Diagnosis not present

## 2018-03-19 DIAGNOSIS — R0602 Shortness of breath: Secondary | ICD-10-CM | POA: Insufficient documentation

## 2018-03-19 LAB — CBC WITH DIFFERENTIAL/PLATELET
Abs Immature Granulocytes: 0.1 10*3/uL (ref 0.0–0.1)
BASOS PCT: 0 %
Basophils Absolute: 0 10*3/uL (ref 0.0–0.1)
EOS ABS: 0.1 10*3/uL (ref 0.0–0.7)
Eosinophils Relative: 1 %
HEMATOCRIT: 17.3 % — AB (ref 39.0–52.0)
Hemoglobin: 5.5 g/dL — CL (ref 13.0–17.0)
IMMATURE GRANULOCYTES: 1 %
LYMPHS ABS: 0.3 10*3/uL — AB (ref 0.7–4.0)
Lymphocytes Relative: 3 %
MCH: 29.4 pg (ref 26.0–34.0)
MCHC: 31.8 g/dL (ref 30.0–36.0)
MCV: 92.5 fL (ref 78.0–100.0)
Monocytes Absolute: 0.8 10*3/uL (ref 0.1–1.0)
Monocytes Relative: 7 %
NEUTROS ABS: 9.9 10*3/uL — AB (ref 1.7–7.7)
NEUTROS PCT: 88 %
PLATELETS: 132 10*3/uL — AB (ref 150–400)
RBC: 1.87 MIL/uL — ABNORMAL LOW (ref 4.22–5.81)
RDW: 16 % — AB (ref 11.5–15.5)
WBC: 11.1 10*3/uL — AB (ref 4.0–10.5)

## 2018-03-19 LAB — PREPARE RBC (CROSSMATCH)

## 2018-03-19 LAB — IRON AND TIBC
Iron: 91 ug/dL (ref 45–182)
SATURATION RATIOS: 45 % — AB (ref 17.9–39.5)
TIBC: 202 ug/dL — ABNORMAL LOW (ref 250–450)
UIBC: 111 ug/dL

## 2018-03-19 MED ORDER — PENTAFLUOROPROP-TETRAFLUOROETH EX AERO: 1.0000 "application " | INHALATION_SPRAY | CUTANEOUS | Status: DC | PRN

## 2018-03-19 MED ORDER — ANTICOAGULANT SODIUM CITRATE 4% (200MG/5ML) IV SOLN
5.0000 mL | Status: AC
  Administered 2018-03-19: 5 mL via INTRAVENOUS
  Filled 2018-03-19: qty 5

## 2018-03-19 MED ORDER — ACETAMINOPHEN 325 MG PO TABS
ORAL_TABLET | ORAL | Status: AC
  Filled 2018-03-19: qty 2

## 2018-03-19 MED ORDER — SODIUM CHLORIDE 0.9 % IV SOLN: 100.0000 mL | INTRAVENOUS | Status: DC | PRN

## 2018-03-19 MED ORDER — DIPHENHYDRAMINE HCL 25 MG PO CAPS
ORAL_CAPSULE | ORAL | Status: AC
  Filled 2018-03-19: qty 1

## 2018-03-19 MED ORDER — HEPARIN SODIUM (PORCINE) 1000 UNIT/ML DIALYSIS: 1000.0000 [IU] | INTRAMUSCULAR | Status: DC | PRN

## 2018-03-19 MED ORDER — OXYCODONE-ACETAMINOPHEN 5-325 MG PO TABS
1.0000 | ORAL_TABLET | Freq: Once | ORAL | Status: AC
  Administered 2018-03-19: 1 via ORAL

## 2018-03-19 MED ORDER — DARBEPOETIN ALFA 100 MCG/0.5ML IJ SOSY
100.0000 ug | PREFILLED_SYRINGE | Freq: Once | INTRAMUSCULAR | Status: AC
  Administered 2018-03-19: 100 ug via INTRAVENOUS

## 2018-03-19 MED ORDER — LIDOCAINE HCL (PF) 1 % IJ SOLN: 5.0000 mL | INTRAMUSCULAR | Status: DC | PRN

## 2018-03-19 MED ORDER — ALTEPLASE 2 MG IJ SOLR: 2.0000 mg | Freq: Once | INTRAMUSCULAR | Status: DC | PRN

## 2018-03-19 MED ORDER — LIDOCAINE-PRILOCAINE 2.5-2.5 % EX CREA: 1.0000 "application " | TOPICAL_CREAM | CUTANEOUS | Status: DC | PRN

## 2018-03-19 MED ORDER — DARBEPOETIN ALFA 100 MCG/0.5ML IJ SOSY
PREFILLED_SYRINGE | INTRAMUSCULAR | Status: AC
Start: 2018-03-19 — End: 2018-03-20
  Filled 2018-03-19: qty 0.5

## 2018-03-19 MED ORDER — PENTAFLUOROPROP-TETRAFLUOROETH EX AERO
1.0000 "application " | INHALATION_SPRAY | CUTANEOUS | Status: DC | PRN
  Filled 2018-03-19: qty 103.5

## 2018-03-19 MED ORDER — SODIUM CHLORIDE 0.9% IV SOLUTION: Freq: Once | INTRAVENOUS | Status: DC

## 2018-03-19 MED ORDER — OXYCODONE-ACETAMINOPHEN 5-325 MG PO TABS
ORAL_TABLET | ORAL | Status: AC
  Filled 2018-03-19: qty 1

## 2018-03-19 MED ORDER — CHLORHEXIDINE GLUCONATE CLOTH 2 % EX PADS: 6.0000 | MEDICATED_PAD | Freq: Every day | CUTANEOUS | Status: DC

## 2018-03-19 MED ORDER — ACETAMINOPHEN 325 MG PO TABS
650.0000 mg | ORAL_TABLET | Freq: Four times a day (QID) | ORAL | Status: DC | PRN
  Administered 2018-03-19: 650 mg via ORAL

## 2018-03-19 MED ORDER — DIPHENHYDRAMINE HCL 25 MG PO CAPS
25.0000 mg | ORAL_CAPSULE | Freq: Four times a day (QID) | ORAL | Status: DC | PRN
  Administered 2018-03-19: 25 mg via ORAL

## 2018-03-19 NOTE — Consult Note (Addendum)
Reason for Consult: hypervolemia, hyperkalemia, uremia Referring Physician: Dr. Sondra Barges is an 37 y.o. male.  HPI: 73M with ESRD on HD but currently without a home dialysis unit (see prior notes) who presents to the hospital with weakness, dyspnea and edema.  His last HD was 8 days ago here.  He complains of edema, dyspnea on minor exertion, nausea.  Requests HD.  CXR with pulm edema, K 6.7 without acute EKG changes.    Says he has chronic back pain and has a pain contrast with an MD with Rx for roxicodone. Requests 1 tablet of opioid pain medication for back pain.   Past Medical History:  Diagnosis Date  . ESRD (end stage renal disease) ([place])   . Hypertension     Past Surgical History:  Procedure Laterality Date  . AV FISTULA PLACEMENT      Family History  Problem Relation Age of Onset  . Migraines Mother     Social History:  reports that he has been smoking cigarettes.  He has been smoking about 0.50 packs per day. He uses smokeless tobacco. He reports that he has current or past drug history. Drug: Marijuana. He reports that he does not drink alcohol.  Allergies:  Allergies  Allergen Reactions  . Heparin Other (See Comments)    Per pt.   . Hydrocodone Nausea And Vomiting  . Hydrocodone-Acetaminophen Other (See Comments)    'Makes me feel really bad'  . Sulfa Antibiotics Hives    Medications: I have reviewed the patient's current medications.  Results for orders placed or performed during the hospital encounter of 03/19/18 (from the past 48 hour(s))  Comprehensive metabolic panel     Status: Abnormal   Collection Time: 03/19/18  3:13 PM  Result Value Ref Range   Sodium 134 (L) 135 - 145 mmol/L   Potassium 6.4 (HH) 3.5 - 5.1 mmol/L    Comment: CRITICAL RESULT CALLED TO, READ BACK BY AND VERIFIED WITH: S Philipp Ovens RN @ 1606 ON 03/19/18    Chloride 97 (L) 98 - 111 mmol/L   CO2 11 (L) 22 - 32 mmol/L   Glucose, Bld 127 (H) 70 - 99 mg/dL   BUN 168  (H) 6 - 20 mg/dL   Creatinine, Ser 23.31 (H) 0.61 - 1.24 mg/dL   Calcium 9.3 8.9 - 10.3 mg/dL   Total Protein 6.2 (L) 6.5 - 8.1 g/dL   Albumin 3.3 (L) 3.5 - 5.0 g/dL   AST 10 (L) 15 - 41 U/L   ALT 8 0 - 44 U/L   Alkaline Phosphatase 49 38 - 126 U/L   Total Bilirubin 0.8 0.3 - 1.2 mg/dL   GFR calc non Af Amer 2 (L) >60 mL/min   GFR calc Af Amer 2 (L) >60 mL/min    Comment: (NOTE) The eGFR has been calculated using the CKD EPI equation. This calculation has not been validated in all clinical situations. eGFR's persistently <60 mL/min signify possible Chronic Kidney Disease.    Anion gap NOT CALCULATED 5 - 15    Comment: Performed at [facility] [address] [place], [place] [address]  CBC with Differential     Status: Abnormal   Collection Time: 03/19/18  3:13 PM  Result Value Ref Range   WBC 11.1 (H) 4.0 - 10.5 K/uL   RBC 1.87 (L) 4.22 - 5.81 MIL/uL   Hemoglobin 5.5 (LL) 13.0 - 17.0 g/dL    Comment: REPEATED TO VERIFY CRITICAL RESULT CALLED TO,  READ BACK BY AND VERIFIED WITH: SALA BLACKWELL RN AT 3875 03/19/18 BY WOOLLENK    HCT 17.3 (L) 39.0 - 52.0 %   MCV 92.5 78.0 - 100.0 fL   MCH 29.4 26.0 - 34.0 pg   MCHC 31.8 30.0 - 36.0 g/dL   RDW 16.0 (H) 11.5 - 15.5 %   Platelets 132 (L) 150 - 400 K/uL   Neutrophils Relative % 88 %   Neutro Abs 9.9 (H) 1.7 - 7.7 K/uL   Lymphocytes Relative 3 %   Lymphs Abs 0.3 (L) 0.7 - 4.0 K/uL   Monocytes Relative 7 %   Monocytes Absolute 0.8 0.1 - 1.0 K/uL   Eosinophils Relative 1 %   Eosinophils Absolute 0.1 0.0 - 0.7 K/uL   Basophils Relative 0 %   Basophils Absolute 0.0 0.0 - 0.1 K/uL   Immature Granulocytes 1 %   Abs Immature Granulocytes 0.1 0.0 - 0.1 K/uL    Comment: Performed at [facility], [place] [address], [place], [place] [address]  Type and screen [place]     Status: None   Collection Time: 03/19/18  5:06 PM  Result Value Ref Range   ABO/RH(D) B POS    Antibody Screen NEG    Sample  Expiration      03/22/2018 Performed at [facility], [place] [address] [place], [place] [address]     Dg Chest 2 View  Result Date: 03/19/2018 CLINICAL DATA:  37 year old male with shortness of breath. History of end-stage renal disease on dialysis but has not dialyzes since 07/26. EXAM: CHEST - 2 VIEW COMPARISON:  Prior chest x-ray 03/01/2018 FINDINGS: Left IJ approach tunneled hemodialysis catheter. Catheter tips project over the distal SVC. Stable cardiomegaly and mediastinal contours. Increased pulmonary vascular congestion with asymmetric right greater than left perihilar interstitial and alveolar airspace opacities. Findings suggest central pulmonary edema. No overt airspace consolidation on the lateral view. No pleural effusion or pneumothorax. No acute osseous abnormality. IMPRESSION: 1. Slightly asymmetric bilateral central interstitial and airspace opacities. In the setting of hemodialysis, findings are favored to reflect asymmetric pulmonary edema secondary to volume overload. 2. Well-positioned left IJ tunneled hemodialysis catheter. Electronically Signed   By: Jacqulynn Cadet M.D.   On: 03/19/2018 15:38    ROS: ROS per HPI Above  Blood pressure 116/66, pulse 69, temperature 98.1 F (36.7 C), temperature source Oral, resp. rate 20, height 6' (1.829 m), weight 83.9 kg (185 lb), SpO2 91 %. Gen: chronically ill appearing man writhing in bed Eyes: anicteric ENT: MMM Neck: JVD to mandible upright CV: RRR, II/VI SEM, no rub Lungs: exp wheeze throughout, rales bases, sl inc WOB Abd: NABS Extr: 2+ pitting LE edema Neuro: myoclonic jerks  Assessment/Plan: 1.  ESRD - Urgent HD tonight for hyperkalemia and volume overload- He's quite uremic and I'm concerned about over dialyzing, but he needs fluid off.  Tolerated treatments 3.5 and 4 hours last 2 here, DFR 800.  I'm going to run 3.5h, DFR 600, UF 4L.  Not good situation, needs home unit.  Note from Dr. Florene Glen 03/04/18 indicated  options for HD unit close to home would be explored.  No info on this available at the moment.  Heparin allergy listed but he just says he's allergic to it due to tendency for bleeding.  I think locking his TDC with heparin is reasonable.   2.  Symptomatic anemia - 2u pRBC today.  Until he has a dialysis unit will continue to  be a problem.  Dose with aranesp 19mg tonight.  Iron profile added on prior to blood, not available but the pRBC will give him some blood.   3.  BMM - didn't address tonight  4.  Chronic back pain - I wrote 1 percocet but told him if I find out he is not actually prescribed this medication I will never write for it again.    KJannifer HickA 03/19/2018, 7:20 PM   Addendum:  I was asked to place a discharge from HD order for this patient.  Medically as long as he receives the 2u pRBC and tolerates the dialysis treatment discharge is ok.  He indicated to me that he did not have transportation home tonight prior to leaving the ED to go to dialysis.  I discussed this with the ED attending who still recommended discharge from the dialysis unit after dialysis.  I placed the discharge order.  LJannifer Hick

## 2018-03-19 NOTE — ED Provider Notes (Addendum)
[facility] EMERGENCY DEPARTMENT Provider Note   CSN: [id number] Arrival date & time: 03/19/18  1430     History   Chief Complaint No chief complaint on file.   HPI Samuel Webb is a 37 y.o. male.  HPI Patient was last dialyzed 8 days ago.  He reports he has fired his nephrologist and this is why he does not have a routine dialysis schedule.  He reports he has gotten quite a bit of swelling in his legs but he is being careful about his fluid intake and is not as bad as is been in the past.  He does have some shortness of breath.  He denies any chest pain.  He reports he has had a cough for a while and thinks he is got a little bit of a respiratory illness.  No fever.  No nausea no vomiting.   Past Medical History:  Diagnosis Date  . ESRD (end stage renal disease) ([place])   . Hypertension     Patient Active Problem List   Diagnosis Date Noted  . Pulmonary edema 03/11/2018  . Hypertensive emergency   . Mood disorder ([place])   . ESRD needing dialysis ([place]) 03/01/2018  . Hyperkalemia 03/01/2018  . Volume overload 03/01/2018  . Hypertension 03/01/2018  . Hypertensive urgency 03/01/2018  . Anemia 03/01/2018  . Acute pulmonary edema [facility])     Past Surgical History:  Procedure Laterality Date  . AV FISTULA PLACEMENT          Home Medications    Prior to Admission medications   Medication Sig Start Date End Date Taking? Authorizing Provider  amLODipine (NORVASC) 10 MG tablet Take 1 tablet (10 mg total) by mouth daily. Patient not taking: Reported on 03/01/2018 02/14/15   Lance Bosch, NP  atenolol (TENORMIN) 50 MG tablet Take 1 tablet (50 mg total) by mouth daily. Patient not taking: Reported on 03/01/2018 02/14/15   Lance Bosch, NP  calcium carbonate (TUMS EX) 750 MG chewable tablet Chew 1 tablet by mouth 3 (three) times daily.     [provider]  clonazePAM (KLONOPIN) 1 MG tablet Take 1 mg by mouth 3 (three) times daily as needed for  anxiety.  02/08/18   [provider]  cloNIDine (CATAPRES) 0.3 MG tablet Take 0.3 mg by mouth 3 (three) times daily. 03/25/17 03/07/18  [provider]  furosemide (LASIX) 40 MG tablet Take 40 mg by mouth 2 (two) times daily. 12/24/17   [provider]  hydrALAZINE (APRESOLINE) 100 MG tablet Take 100 mg by mouth 3 (three) times daily. 02/08/18   [provider]  labetalol (NORMODYNE) 200 MG tablet Take 800 mg by mouth 3 (three) times daily. 03/25/17 03/07/18  [provider]  NIFEdipine (PROCARDIA XL/ADALAT-CC) 90 MG 24 hr tablet Take 90 mg by mouth 2 (two) times daily. 03/25/17 03/07/18  [provider]  pantoprazole (PROTONIX) 40 MG tablet Take 40 mg by mouth daily. 01/08/18   [provider]  QUEtiapine (SEROQUEL) 100 MG tablet Take 100 mg by mouth daily. 02/05/18   [provider]  QUEtiapine (SEROQUEL) 300 MG tablet Take 300 mg by mouth at bedtime. 02/05/18   [provider]  sevelamer carbonate (RENVELA) 800 MG tablet Take 800-1,600 mg by mouth See admin instructions. Take 2 tablets three times daily with meals then take 1 tablet with snacks 02/05/18   [provider]    Family History Family History  Problem Relation Age of Onset  .  Migraines Mother     Social History Social History   Tobacco Use  . Smoking status: Current Every Day Smoker    Packs/day: 0.50    Types: Cigarettes  . Smokeless tobacco: Current User  Substance Use Topics  . Alcohol use: No    Alcohol/week: 0.0 oz  . Drug use: Yes    Types: Marijuana    Comment: occasionally     Allergies   Heparin; Hydrocodone; Hydrocodone-acetaminophen; and Sulfa antibiotics   Review of Systems Review of Systems 10 Systems reviewed and are negative for acute change except as noted in the HPI.   Physical Exam Updated Vital Signs BP 116/66 (BP Location: Right Arm)   Pulse 69   Temp 98.1 F (36.7 C) (Oral)   Resp 20   Ht 6' (1.829 m)   Wt  83.9 kg (185 lb)   SpO2 91%   BMI 25.09 kg/m   Physical Exam  Constitutional: He is oriented to person, place, and time.  Patient is deconditioned in appearance.  He is generally pale.  Mental status is clear.  No respiratory distress at rest.  Patient is subtly tremulous movements of his hands.Marland Kitchen  HENT:  Head: Normocephalic.  Eyes: EOM are normal.  Cardiovascular:  Heart is regular.  2\6 systolic ejection murmur.  Pulmonary/Chest:  No respiratory distress.  Fine rales at the bases of lung fields.  Occasional expiratory wheeze.  Abdominal: Soft. He exhibits no distension. There is no tenderness. There is no guarding.  Musculoskeletal:  2+ pitting edema bilateral lower extremities.  Neurological: He is alert and oriented to person, place, and time. He exhibits normal muscle tone. Coordination normal.  Skin: Skin is warm and dry. There is pallor.  Psychiatric: He has a normal mood and affect.     ED Treatments / Results  Labs (all labs ordered are listed, but only abnormal results are displayed) Labs Reviewed  COMPREHENSIVE METABOLIC PANEL - Abnormal; Notable for the following components:      Result Value   Sodium 134 (*)    Potassium 6.4 (*)    Chloride 97 (*)    CO2 11 (*)    Glucose, Bld 127 (*)    BUN 168 (*)    Creatinine, Ser 23.31 (*)    Total Protein 6.2 (*)    Albumin 3.3 (*)    AST 10 (*)    GFR calc non Af Amer 2 (*)    GFR calc Af Amer 2 (*)    All other components within normal limits  CBC WITH DIFFERENTIAL/PLATELET - Abnormal; Notable for the following components:   WBC 11.1 (*)    RBC 1.87 (*)    Hemoglobin 5.5 (*)    HCT 17.3 (*)    RDW 16.0 (*)    Platelets 132 (*)    Neutro Abs 9.9 (*)    Lymphs Abs 0.3 (*)    All other components within normal limits  I-STAT [facility], ED  TYPE AND SCREEN    EKG EKG Interpretation  Date/Time:  Saturday March 19 2018 14:53:44 EDT Ventricular Rate:  71 PR Interval:  196 QRS Duration: 108 QT  Interval:  430 QTC Calculation: 467 R Axis:   50 Text Interpretation:  Normal sinus rhythm Incomplete right bundle branch block Borderline ECG no change from previous Confirmed by Charlesetta Shanks [phone]) on 03/19/2018 5:22:54 PM   Radiology Dg Chest 2 View  Result Date: 03/19/2018 CLINICAL DATA:  37 year old male with shortness of breath. History of end-stage renal  disease on dialysis but has not dialyzes since 07/26. EXAM: CHEST - 2 VIEW COMPARISON:  Prior chest x-ray 03/01/2018 FINDINGS: Left IJ approach tunneled hemodialysis catheter. Catheter tips project over the distal SVC. Stable cardiomegaly and mediastinal contours. Increased pulmonary vascular congestion with asymmetric right greater than left perihilar interstitial and alveolar airspace opacities. Findings suggest central pulmonary edema. No overt airspace consolidation on the lateral view. No pleural effusion or pneumothorax. No acute osseous abnormality. IMPRESSION: 1. Slightly asymmetric bilateral central interstitial and airspace opacities. In the setting of hemodialysis, findings are favored to reflect asymmetric pulmonary edema secondary to volume overload. 2. Well-positioned left IJ tunneled hemodialysis catheter. Electronically Signed   By: Jacqulynn Cadet M.D.   On: 03/19/2018 15:38    Procedures Procedures (including critical care time) CRITICAL CARE Performed by: Charlesetta Shanks   Total critical care time: 30  minutes  Critical care time was exclusive of separately billable procedures and treating other patients.  Critical care was necessary to treat or prevent imminent or life-threatening deterioration.  Critical care was time spent personally by me on the following activities: development of treatment plan with patient and/or surrogate as well as nursing, discussions with consultants, evaluation of patient's response to treatment, examination of patient, obtaining history from patient or surrogate, ordering and  performing treatments and interventions, ordering and review of laboratory studies, ordering and review of radiographic studies, pulse oximetry and re-evaluation of patient's condition. Medications Ordered in ED Medications - No data to display   Initial Impression / Assessment and Plan / ED Course  I have reviewed the triage vital signs and the nursing notes.  Pertinent labs & imaging results that were available during my care of the patient were reviewed by me and considered in my medical decision making (see chart for details).  Clinical Course as of Mar 19 1818  Sat Mar 19, 2018  1811 I have reviewed with Dr. Johnney Ou.  She will check to see when patient will be able to get dialysis.  Agrees with awaiting dialysis for blood transfusion given patient's chronic anemia with vital signs stable and clinical volume overload.   [MP]  1815 Dr. Johnney Ou advises they will be able to take him to dialysis soon and without EKG changes, we do not need to start treatment for hyperkalemia at this time.   [MP]    Clinical Course User Index [MP] Charlesetta Shanks, MD   16: 00 patient is currently refusing peripheral IV.  He reports that it takes them 7 or more times to get an IV and it only actually gets used sometimes.  He reports he does not want to get an IV that may or may not get used.  I advised him that he has multiple serious medical conditions and his condition could abruptly change necessitating use of a peripheral IV.  I counseled him that trying to obtain access under those conditions is much more difficult and risks compromising our ability to provide care.  Also risk of potential for needing emergent central access if that were to occur.  At this time, patient continues to refuse peripheral access.  Will readdress.  17:46: Consult ordered [id number] for nephrology.  Consult reordered 15: 17.  Message for consult sent through AMION 17: 45 to provider listed Johnney Ou.  18: 08 Patient reluctantly agrees to  allow me attempt at ultrasound IV placement.  He reports he already knows he has low blood count and elevated potassium.  Final Clinical Impressions(s) / ED Diagnoses   Final  diagnoses:  ESRD needing dialysis ([place])  Anemia due to chronic kidney disease, on chronic dialysis ([place])  Hyperkalemia   Patient has irregular dialysis due to social problems.  He has not been dialyzed for 8 days.  It is chronically ill and has had chronic anemia.  His mental status is clear.  Heart rate and blood pressure are stable.  Clinically he has volume overload with crackles and lower extremity edema but not active respiratory distress at rest.  As per consultation notes, patient will be taken from the emergency department to dialysis where blood transfusion can be done while the patient is being dialyzed. ED Discharge Orders    None       Charlesetta Shanks, MD 03/19/18 Effie Berkshire, MD 03/19/18 [phone]

## 2018-03-19 NOTE — ED Notes (Signed)
Critical potassium 6.4 reported top Dr Johnney Killian

## 2018-03-19 NOTE — ED Notes (Signed)
Critical hgb 5.5 reported to Dr Johnney Killian

## 2018-03-19 NOTE — ED Triage Notes (Signed)
Pt to ER for dialysis, states has not had treatment since last Friday (7/26). Pt appears ill. Diaphoretic, pale.

## 2018-03-20 LAB — TYPE AND SCREEN
ABO/RH(D): B POS
ANTIBODY SCREEN: NEGATIVE
Unit division: 0
Unit division: 0

## 2018-03-20 LAB — COMPREHENSIVE METABOLIC PANEL
ALT: 8 U/L (ref 0–44)
AST: 10 U/L — ABNORMAL LOW (ref 15–41)
Albumin: 3.3 g/dL — ABNORMAL LOW (ref 3.5–5.0)
Alkaline Phosphatase: 49 U/L (ref 38–126)
BUN: 168 mg/dL — ABNORMAL HIGH (ref 6–20)
CHLORIDE: 97 mmol/L — AB (ref 98–111)
CO2: 11 mmol/L — ABNORMAL LOW (ref 22–32)
CREATININE: 23.31 mg/dL — AB (ref 0.61–1.24)
Calcium: 9.3 mg/dL (ref 8.9–10.3)
GFR, EST AFRICAN AMERICAN: 2 mL/min — AB (ref >60)
GFR, EST NON AFRICAN AMERICAN: 2 mL/min — AB (ref >60)
Glucose, Bld: 127 mg/dL — ABNORMAL HIGH (ref 70–99)
Potassium: 6.4 mmol/L (ref 3.5–5.1)
Sodium: 134 mmol/L — ABNORMAL LOW (ref 135–145)
Total Bilirubin: 0.8 mg/dL (ref 0.3–1.2)
Total Protein: 6.2 g/dL — ABNORMAL LOW (ref 6.5–8.1)

## 2018-03-20 LAB — BPAM RBC
BLOOD PRODUCT EXPIRATION DATE: 201908292359
Blood Product Expiration Date: 201908032359
ISSUE DATE / TIME: 201908031940
ISSUE DATE / TIME: 201908031940
UNIT TYPE AND RH: 7300
Unit Type and Rh: 7300

## 2018-12-15 ENCOUNTER — Telehealth (HOSPITAL_COMMUNITY): Payer: Self-pay | Admitting: Psychiatry

## 2018-12-15 NOTE — Telephone Encounter (Signed)
D:  Pt was referred to [facility] by Dr. Rosalia Hammers (psychologist) office.  His office had faxed over multiple pages from [facility], in which that office had faxed the referral over to him.  A:  Building surveyor, RN is declining pt at this time due to him being a medical risk, along with dx of anti-social personality.  [facility] was called and informed Brayton Layman of denial.

## 2019-06-15 IMAGING — DX DG CHEST 2V
2 series · 2 of 2 positions shown · non-contrast
Comparison: Prior chest x-ray 03/01/2018

CLINICAL DATA: 36-year-old male with shortness of breath. History
of end-stage renal disease on dialysis but has not dialyzes since
[DATE].

EXAM:
CHEST - 2 VIEW

[w chest lat]
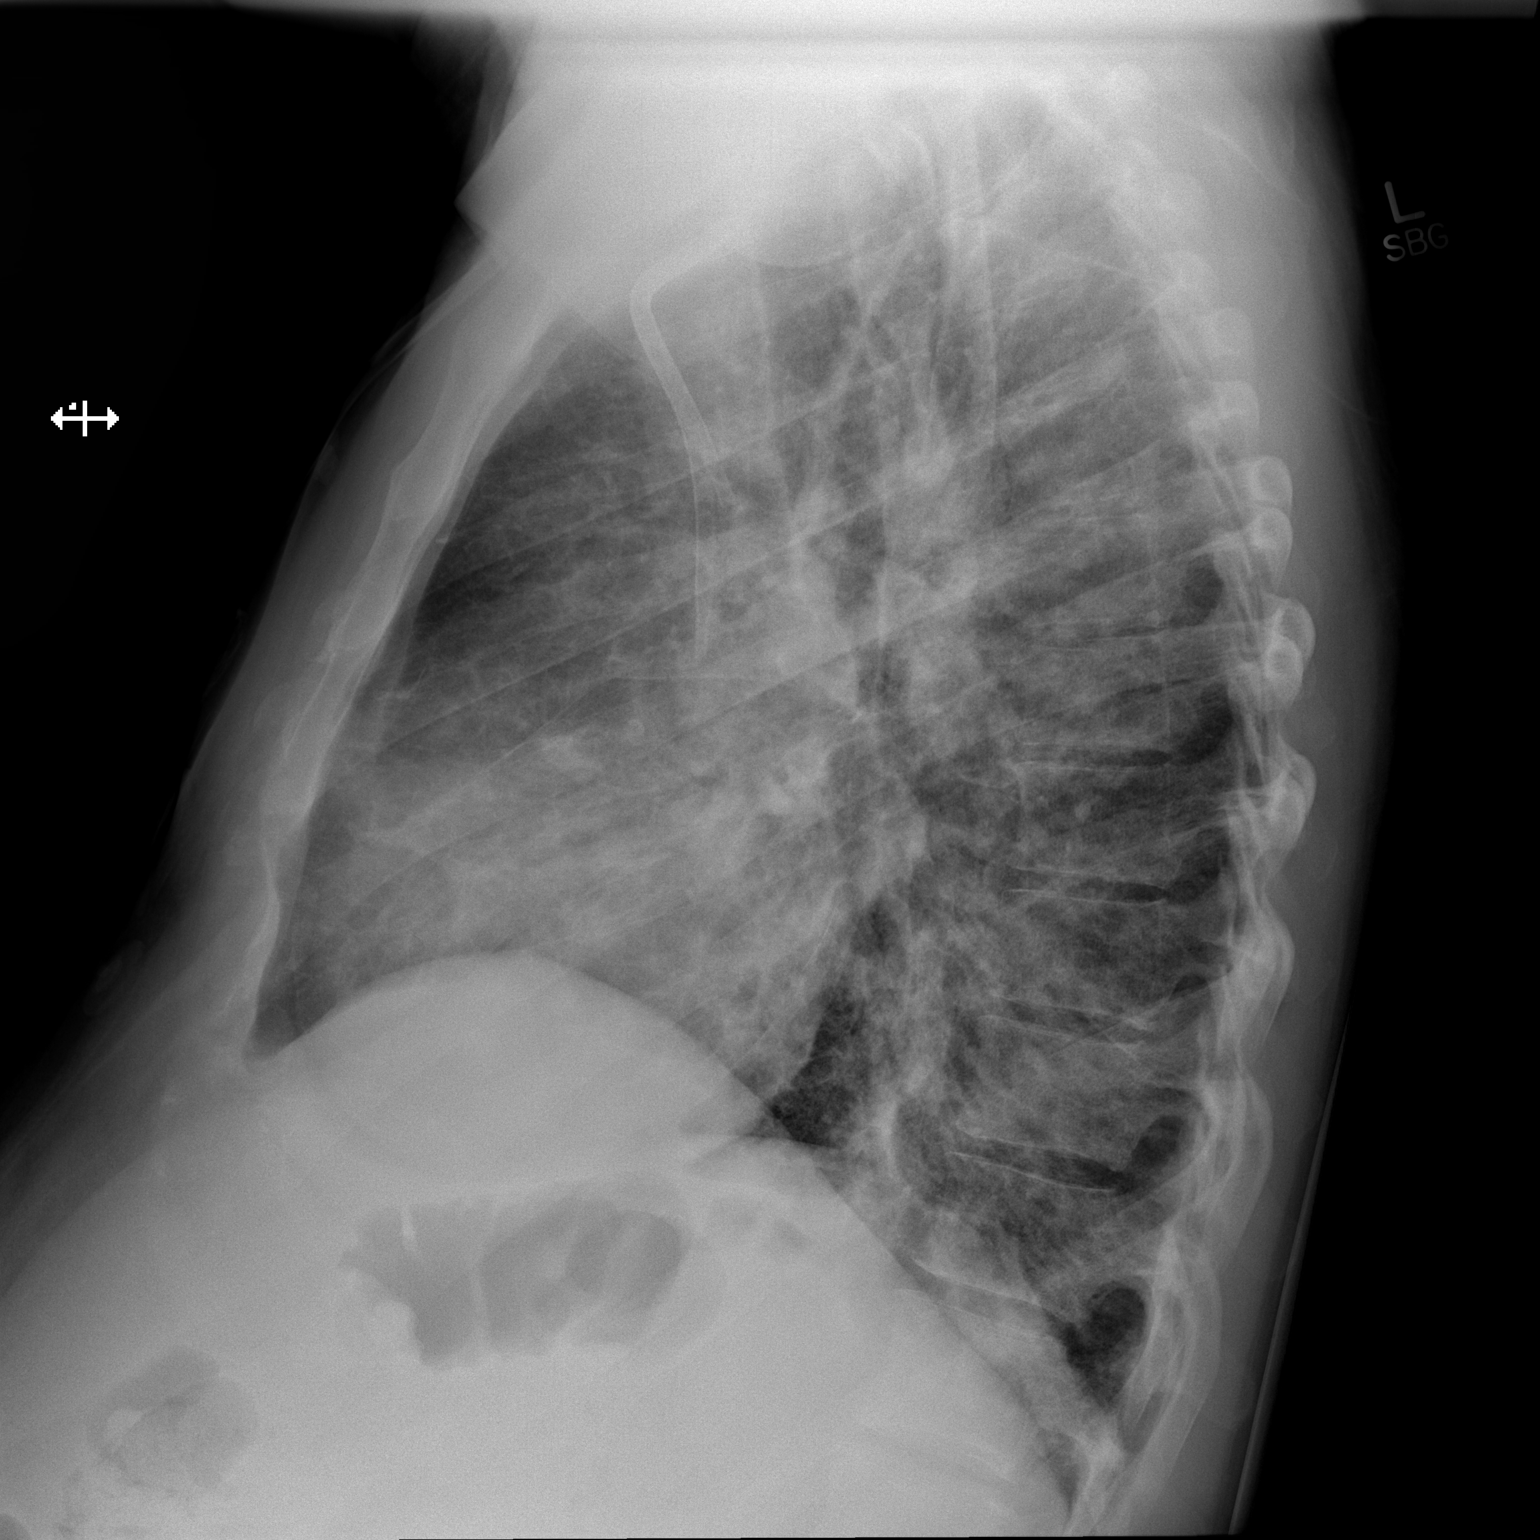

[w chest pa]
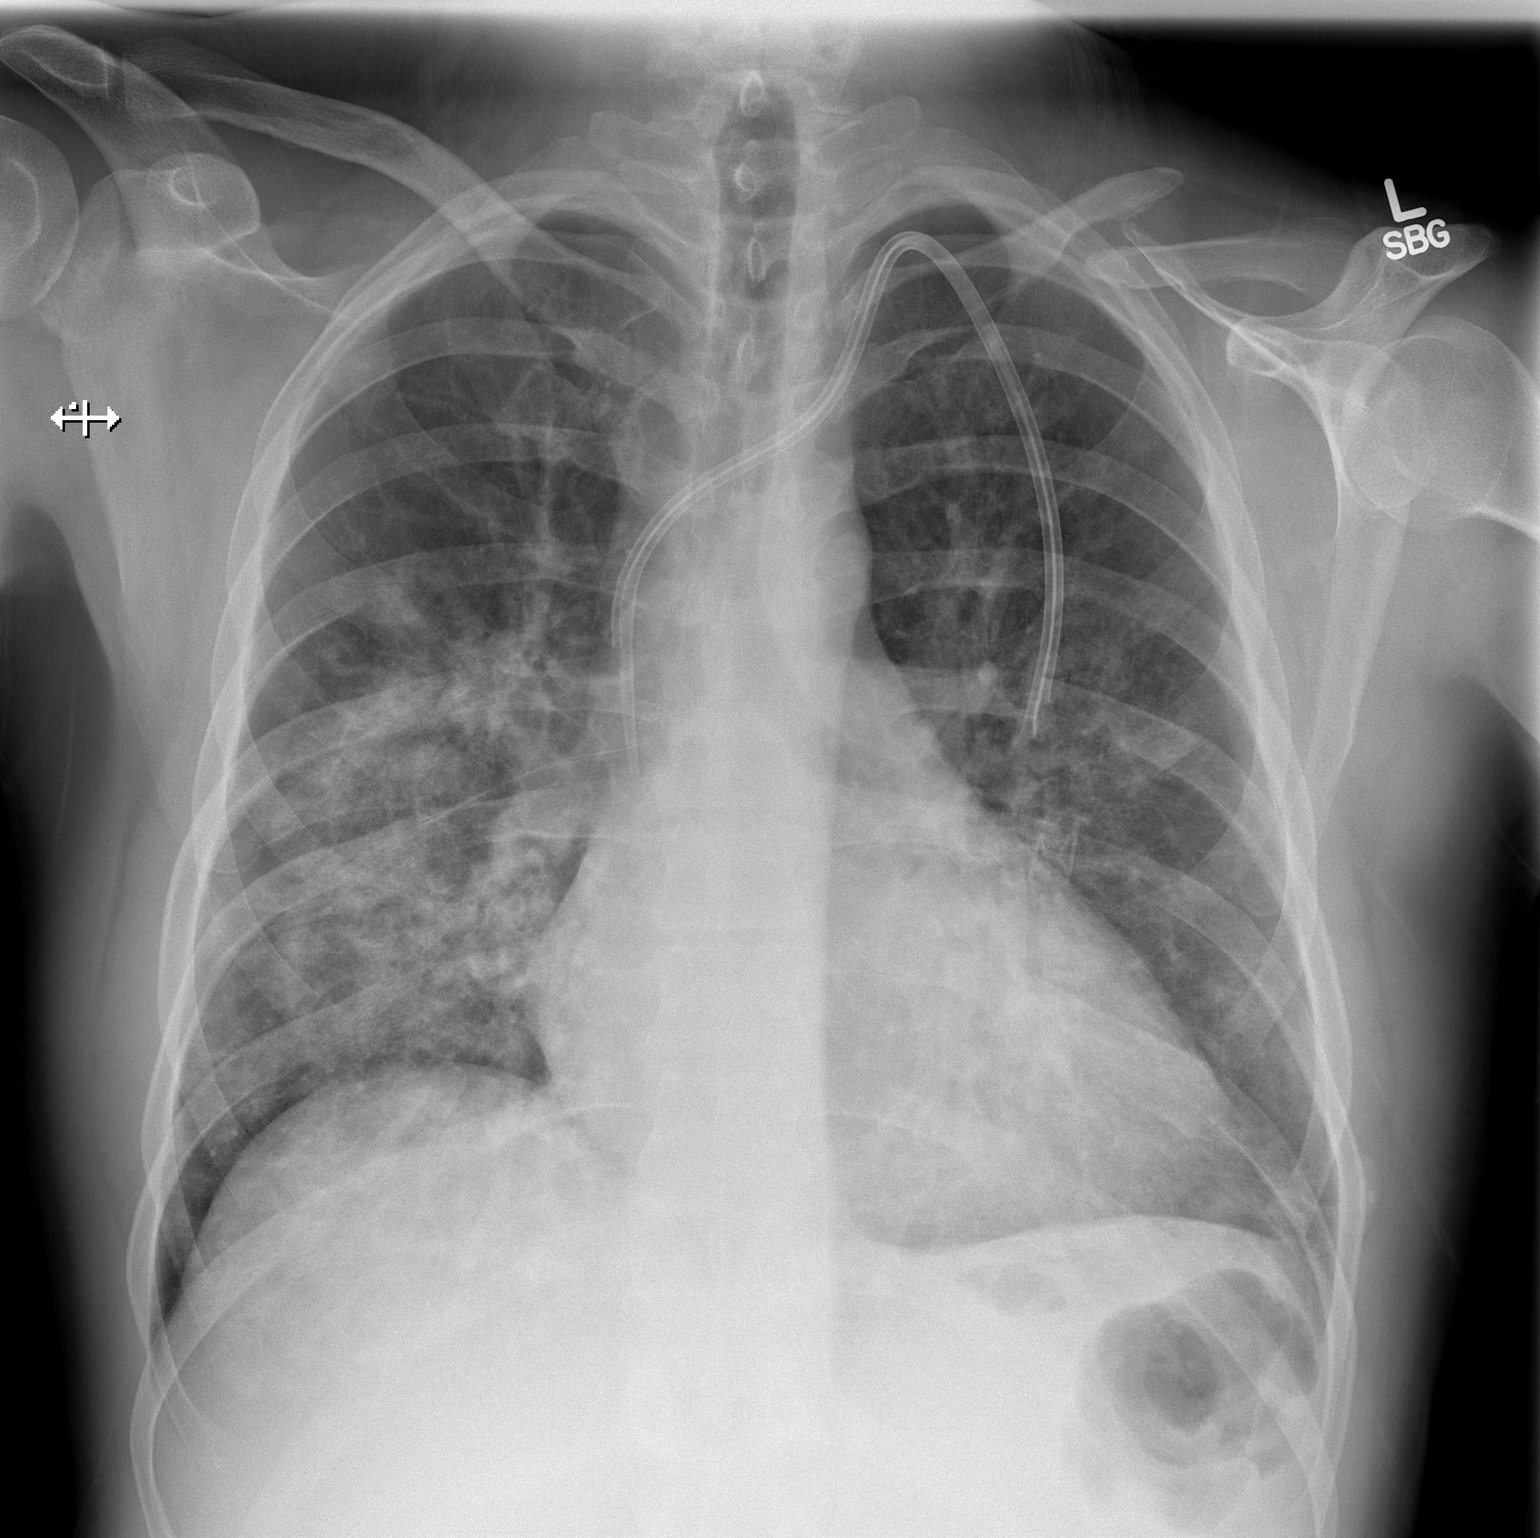

[2 of 2 positions shown; findings below may reference images not displayed]

FINDINGS: Left IJ approach tunneled hemodialysis catheter. Catheter tips
project over the distal SVC. Stable cardiomegaly and mediastinal
contours. Increased pulmonary vascular congestion with asymmetric
right greater than left perihilar interstitial and alveolar airspace
opacities. Findings suggest central pulmonary edema. No overt
airspace consolidation on the lateral view. No pleural effusion or
pneumothorax. No acute osseous abnormality.
IMPRESSION: 1. Slightly asymmetric bilateral central interstitial and airspace
opacities. In the setting of hemodialysis, findings are favored to
reflect asymmetric pulmonary edema secondary to volume overload.
2. Well-positioned left IJ tunneled hemodialysis catheter.

## 2019-12-09 ENCOUNTER — Other Ambulatory Visit: Payer: Self-pay

## 2019-12-09 ENCOUNTER — Encounter (HOSPITAL_COMMUNITY): Payer: Self-pay | Admitting: Emergency Medicine

## 2019-12-09 ENCOUNTER — Emergency Department (HOSPITAL_COMMUNITY): Payer: Medicare Other

## 2019-12-09 ENCOUNTER — Emergency Department (HOSPITAL_COMMUNITY)
Admission: EM | Admit: 2019-12-09 | Discharge: 2019-12-09 | Discharge status: Against Medical Advice | Payer: Medicare Other | Attending: Emergency Medicine | Admitting: Emergency Medicine

## 2019-12-09 DIAGNOSIS — N186 End stage renal disease: Secondary | ICD-10-CM | POA: Diagnosis not present

## 2019-12-09 DIAGNOSIS — J810 Acute pulmonary edema: Secondary | ICD-10-CM | POA: Diagnosis not present

## 2019-12-09 DIAGNOSIS — Z992 Dependence on renal dialysis: Secondary | ICD-10-CM | POA: Insufficient documentation

## 2019-12-09 DIAGNOSIS — F17210 Nicotine dependence, cigarettes, uncomplicated: Secondary | ICD-10-CM | POA: Diagnosis not present

## 2019-12-09 DIAGNOSIS — Z79899 Other long term (current) drug therapy: Secondary | ICD-10-CM | POA: Diagnosis not present

## 2019-12-09 DIAGNOSIS — I161 Hypertensive emergency: Secondary | ICD-10-CM | POA: Diagnosis not present

## 2019-12-09 DIAGNOSIS — R0602 Shortness of breath: Secondary | ICD-10-CM | POA: Diagnosis present

## 2019-12-09 DIAGNOSIS — F1210 Cannabis abuse, uncomplicated: Secondary | ICD-10-CM | POA: Diagnosis not present

## 2019-12-09 DIAGNOSIS — E875 Hyperkalemia: Secondary | ICD-10-CM

## 2019-12-09 DIAGNOSIS — D631 Anemia in chronic kidney disease: Secondary | ICD-10-CM | POA: Diagnosis not present

## 2019-12-09 HISTORY — DX: Anemia, unspecified: D64.9

## 2019-12-09 HISTORY — DX: Pure hypercholesterolemia, unspecified: E78.00

## 2019-12-09 HISTORY — DX: Gastroparesis: K31.84

## 2019-12-09 HISTORY — DX: Polyneuropathy, unspecified: G62.9

## 2019-12-09 HISTORY — DX: Migraine, unspecified, not intractable, without status migrainosus: G43.909

## 2019-12-09 LAB — BASIC METABOLIC PANEL
Anion gap: 14 (ref 5–15)
BUN: 56 mg/dL — ABNORMAL HIGH (ref 6–20)
CO2: 22 mmol/L (ref 22–32)
Calcium: 8.9 mg/dL (ref 8.9–10.3)
Chloride: 103 mmol/L (ref 98–111)
Creatinine, Ser: 8.07 mg/dL — ABNORMAL HIGH (ref 0.61–1.24)
GFR calc Af Amer: 9 mL/min — ABNORMAL LOW (ref >60)
GFR calc non Af Amer: 8 mL/min — ABNORMAL LOW (ref >60)
Glucose, Bld: 101 mg/dL — ABNORMAL HIGH (ref 70–99)
Potassium: 6 mmol/L — ABNORMAL HIGH (ref 3.5–5.1)
Sodium: 139 mmol/L (ref 135–145)

## 2019-12-09 LAB — TYPE AND SCREEN
ABO/RH(D): B POS
Antibody Screen: NEGATIVE

## 2019-12-09 LAB — CBC
HCT: 20.9 % — ABNORMAL LOW (ref 39.0–52.0)
Hemoglobin: 6.6 g/dL — CL (ref 13.0–17.0)
MCH: 30.4 pg (ref 26.0–34.0)
MCHC: 31.6 g/dL (ref 30.0–36.0)
MCV: 96.3 fL (ref 80.0–100.0)
Platelets: 165 10*3/uL (ref 150–400)
RBC: 2.17 MIL/uL — ABNORMAL LOW (ref 4.22–5.81)
RDW: 16 % — ABNORMAL HIGH (ref 11.5–15.5)
WBC: 10.7 10*3/uL — ABNORMAL HIGH (ref 4.0–10.5)
nRBC: 0 % (ref 0.0–0.2)

## 2019-12-09 LAB — TROPONIN I (HIGH SENSITIVITY): Troponin I (High Sensitivity): 37 ng/L — ABNORMAL HIGH (ref <18)

## 2019-12-09 MED ORDER — SODIUM CHLORIDE 0.9% FLUSH: 3.0000 mL | Freq: Once | INTRAVENOUS | Status: DC

## 2019-12-09 MED ORDER — ACETAMINOPHEN 500 MG PO TABS
1000.00 | ORAL_TABLET | ORAL | Status: DC
Start: ? — End: 2019-12-09

## 2019-12-09 MED ORDER — CALCIUM CARBONATE 1250 (500 CA) MG PO CHEW
1000.00 | CHEWABLE_TABLET | ORAL | Status: DC
Start: ? — End: 2019-12-09

## 2019-12-09 MED ORDER — GENERIC EXTERNAL MEDICATION
5.00 | Status: DC
Start: ? — End: 2019-12-09

## 2019-12-09 MED ORDER — CYANOCOBALAMIN 100 MCG PO TABS
100.00 | ORAL_TABLET | ORAL | Status: DC
Start: ? — End: 2019-12-09

## 2019-12-09 MED ORDER — FLUOXETINE HCL 10 MG PO CAPS
10.00 | ORAL_CAPSULE | ORAL | Status: DC
Start: ? — End: 2019-12-09

## 2019-12-09 MED ORDER — NITROGLYCERIN IN D5W 200-5 MCG/ML-% IV SOLN
0.0000 ug/min | INTRAVENOUS | Status: DC
  Filled 2019-12-09: qty 250

## 2019-12-09 MED ORDER — LABETALOL HCL 200 MG PO TABS
800.00 | ORAL_TABLET | ORAL | Status: DC
Start: 2019-12-08 — End: 2019-12-09

## 2019-12-09 MED ORDER — NIFEDIPINE ER OSMOTIC RELEASE 30 MG PO TB24
90.00 | ORAL_TABLET | ORAL | Status: DC
Start: ? — End: 2019-12-09

## 2019-12-09 MED ORDER — ASPIRIN 81 MG PO TBEC
81.00 | DELAYED_RELEASE_TABLET | ORAL | Status: DC
Start: ? — End: 2019-12-09

## 2019-12-09 MED ORDER — PANTOPRAZOLE SODIUM 40 MG PO TBEC
40.00 | DELAYED_RELEASE_TABLET | ORAL | Status: DC
Start: ? — End: 2019-12-09

## 2019-12-09 MED ORDER — NIFEDIPINE ER OSMOTIC RELEASE 30 MG PO TB24
90.00 | ORAL_TABLET | ORAL | Status: DC
Start: 2019-12-07 — End: 2019-12-09

## 2019-12-09 MED ORDER — BUTALBITAL-APAP-CAFFEINE 50-325-40 MG PO TABS
1.00 | ORAL_TABLET | ORAL | Status: DC
Start: ? — End: 2019-12-09

## 2019-12-09 MED ORDER — CLONIDINE HCL 0.1 MG PO TABS
0.10 | ORAL_TABLET | ORAL | Status: DC
Start: 2019-12-07 — End: 2019-12-09

## 2019-12-09 MED ORDER — SEVELAMER CARBONATE 800 MG PO TABS
1600.00 | ORAL_TABLET | ORAL | Status: DC
Start: 2019-12-08 — End: 2019-12-09

## 2019-12-09 MED ORDER — ATORVASTATIN CALCIUM 10 MG PO TABS
20.00 | ORAL_TABLET | ORAL | Status: DC
Start: 2019-12-08 — End: 2019-12-09

## 2019-12-09 MED ORDER — HYDRALAZINE HCL 50 MG PO TABS
100.00 | ORAL_TABLET | ORAL | Status: DC
Start: 2019-12-07 — End: 2019-12-09

## 2019-12-09 MED ORDER — PREDNISONE 5 MG PO TABS
5.00 | ORAL_TABLET | ORAL | Status: DC
Start: ? — End: 2019-12-09

## 2019-12-09 MED ORDER — SEVELAMER CARBONATE 800 MG PO TABS
1600.00 | ORAL_TABLET | ORAL | Status: DC
Start: ? — End: 2019-12-09

## 2019-12-09 MED ORDER — CLONIDINE HCL 0.1 MG PO TABS
0.10 | ORAL_TABLET | ORAL | Status: DC
Start: ? — End: 2019-12-09

## 2019-12-09 MED ORDER — ASCORBIC ACID 500 MG PO TABS
500.00 | ORAL_TABLET | ORAL | Status: DC
Start: ? — End: 2019-12-09

## 2019-12-09 MED ORDER — HYDRALAZINE HCL 50 MG PO TABS
100.00 | ORAL_TABLET | ORAL | Status: DC
Start: 2019-12-08 — End: 2019-12-09

## 2019-12-09 MED ORDER — SODIUM CHLORIDE 0.9 % IV SOLN
250.00 | INTRAVENOUS | Status: DC
Start: ? — End: 2019-12-09

## 2019-12-09 MED ORDER — CHOLECALCIFEROL 25 MCG (1000 UT) PO TABS
5000.00 | ORAL_TABLET | ORAL | Status: DC
Start: ? — End: 2019-12-09

## 2019-12-09 MED ORDER — OXYCODONE HCL 5 MG PO TABS
10.00 | ORAL_TABLET | ORAL | Status: DC
Start: ? — End: 2019-12-09

## 2019-12-09 MED ORDER — LABETALOL HCL 200 MG PO TABS
800.00 | ORAL_TABLET | ORAL | Status: DC
Start: 2019-12-07 — End: 2019-12-09

## 2019-12-09 MED ORDER — FUROSEMIDE 40 MG PO TABS
80.00 | ORAL_TABLET | ORAL | Status: DC
Start: ? — End: 2019-12-09

## 2019-12-09 NOTE — Consult Note (Addendum)
Date: 12/09/2019               Patient Name:  Samuel Webb MRN: [id number]  DOB: [birth year] Age / Sex: 39 y.o., male   PCP: Rikki Spearing, NP         Requesting Physician: Dr. Hayden Rasmussen, MD    Consulting Reason:  Hypertension     Chief Complaint: Shortness of breath  History of Present Illness:  [name] is a 39 yo M w/ PMH of bipolar disorder, ESRD, HTN who presents to [facility] with complaints of dyspnea. Attempted to be evaluate him in ED with nephrology team and HPOA present. [name] unfortunately refused to be evaluated after becoming upset that he would have to undergo COVID test prior to being admitted. He was observed pulling off his telemetry lead and yelling at all providers present. Claimed he will leave against medical advice. Discussed risk of leaving AMA with the patient. [name] expressed understanding but requested that we leave the room.  Meds: Current Facility-Administered Medications  Medication Dose Route Frequency Provider Last Rate Last Admin  . nitroGLYCERIN 50 mg in dextrose 5 % 250 mL (0.2 mg/mL) infusion  0-200 mcg/min Intravenous Continuous Carlisle Cater, PA-C      . sodium chloride flush (NS) 0.9 % injection 3 mL  3 mL Intravenous Once Hayden Rasmussen, MD       Current Outpatient Medications  Medication Sig Dispense Refill  . amLODipine (NORVASC) 10 MG tablet Take 1 tablet (10 mg total) by mouth daily. (Patient not taking: Reported on 03/01/2018) 90 tablet 3  . aspirin-acetaminophen-caffeine (EXCEDRIN MIGRAINE) 250-250-65 MG tablet Take 1 tablet by mouth every 6 (six) hours as needed for headache.    Marland Kitchen atenolol (TENORMIN) 50 MG tablet Take 1 tablet (50 mg total) by mouth daily. (Patient not taking: Reported on 03/01/2018) 30 tablet 4  . calcium carbonate (TUMS EX) 750 MG chewable tablet Chew 1 tablet by mouth 3 (three) times daily.     . clonazePAM (KLONOPIN) 1 MG tablet Take 1 mg by mouth 3 (three) times daily as needed for anxiety.   0  . cloNIDine  (CATAPRES) 0.3 MG tablet Take 0.3 mg by mouth 3 (three) times daily.    . diphenhydrAMINE (BENADRYL) 25 mg capsule Take 50 mg by mouth at bedtime.    . furosemide (LASIX) 40 MG tablet Take 40 mg by mouth 2 (two) times daily.    . hydrALAZINE (APRESOLINE) 100 MG tablet Take 100 mg by mouth 3 (three) times daily.  0  . labetalol (NORMODYNE) 200 MG tablet Take 800 mg by mouth 3 (three) times daily.    Marland Kitchen NIFEdipine (PROCARDIA XL/ADALAT-CC) 90 MG 24 hr tablet Take 90 mg by mouth 2 (two) times daily.    . pantoprazole (PROTONIX) 40 MG tablet Take 40 mg by mouth daily.  0  . QUEtiapine (SEROQUEL) 100 MG tablet Take 100 mg by mouth daily.  0  . QUEtiapine (SEROQUEL) 300 MG tablet Take 300 mg by mouth at bedtime.  0  . sevelamer carbonate (RENVELA) 800 MG tablet Take 800-1,600 mg by mouth See admin instructions. Take 2 tablets three times daily with meals then take 1 tablet with snacks  0    Allergies: Allergies as of 12/09/2019 - Review Complete 03/19/2018  Allergen Reaction Noted  . Heparin Other (See Comments) 07/09/2017  . Hydrocodone Nausea And Vomiting 12/06/2017  . Hydrocodone-acetaminophen Other (See Comments) 08/25/2016  . Sulfa antibiotics Hives 12/01/2014   Past Medical History:  Diagnosis Date  . Anemia   . ESRD (end stage renal disease) ([place])   . Gastroparesis   . High cholesterol   . Hypertension   . Migraines   . Neuropathy    Past Surgical History:  Procedure Laterality Date  . AV FISTULA PLACEMENT    . CHOLECYSTECTOMY     Family History  Problem Relation Age of Onset  . Migraines Mother    Social History   Socioeconomic History  . Marital status: Single    Spouse name: Not on file  . Number of children: Not on file  . Years of education: Not on file  . Highest education level: Not on file  Occupational History  . Not on file  Tobacco Use  . Smoking status: Current Every Day Smoker    Packs/day: 0.50    Types: Cigarettes  . Smokeless tobacco: Current User   Substance and Sexual Activity  . Alcohol use: No    Alcohol/week: 0.0 standard drinks  . Drug use: Yes    Types: Marijuana    Comment: occasionally  . Sexual activity: Not on file  Other Topics Concern  . Not on file  Social History Narrative  . Not on file   Social Determinants of Health   Financial Resource Strain:   . Difficulty of Paying Living Expenses:   Food Insecurity:   . Worried About Charity fundraiser in the Last Year:   . Arboriculturist in the Last Year:   Transportation Needs:   . Film/video editor (Medical):   Marland Kitchen Lack of Transportation (Non-Medical):   Physical Activity:   . Days of Exercise per Week:   . Minutes of Exercise per Session:   Stress:   . Feeling of Stress :   Social Connections:   . Frequency of Communication with Friends and Family:   . Frequency of Social Gatherings with Friends and Family:   . Attends Religious Services:   . Active Member of Clubs or Organizations:   . Attends Archivist Meetings:   Marland Kitchen Marital Status:   Intimate Partner Violence:   . Fear of Current or Ex-Partner:   . Emotionally Abused:   Marland Kitchen Physically Abused:   . Sexually Abused:    Review of Systems: Review of systems not obtained due to patient factors.  Physical Exam: Blood pressure (!) 235/114, pulse 62, temperature 98.1 F (36.7 C), temperature source Oral, resp. rate 18, height 6' (1.829 m), weight 86.2 kg, SpO2 100 %. No exam performed today, patient refused exam.  Lab results: BMP Latest Ref Rng & Units 12/09/2019 03/19/2018 03/11/2018  Glucose 70 - 99 mg/dL 101(H) 127(H) 105(H)  BUN 6 - 20 mg/dL 56(H) 168(H) 157(H)  Creatinine 0.61 - 1.24 mg/dL 8.07(H) 23.31(H) 21.17(H)  Sodium 135 - 145 mmol/L 139 134(L) 139  Potassium 3.5 - 5.1 mmol/L 6.0(H) 6.4(HH) 7.0(HH)  Chloride 98 - 111 mmol/L 103 97(L) 101  CO2 22 - 32 mmol/L 22 11(L) 11(L)  Calcium 8.9 - 10.3 mg/dL 8.9 9.3 10.2   Imaging results:  DG Chest 2 View  Result Date: 12/09/2019  CLINICAL DATA:  39 year old male with history of shortness of breath since 4 a.m. today. EXAM: CHEST - 2 VIEW COMPARISON:  Chest x-ray 11/26/2019. FINDINGS: Right IJ PermCath with tip terminating in the right atrium. Lung volumes are normal. Small right pleural effusion, much of which is tracking in the inferior aspect of the right major fissure. There is cephalization of the pulmonary  vasculature and slight indistinctness of the interstitial markings suggestive of mild pulmonary edema. No left pleural effusion. Mild cardiomegaly. Upper mediastinal contours are within normal limits. IMPRESSION: 1. The appearance the chest suggests mild congestive heart failure. 2. Small right pleural effusion, much of which tracks in the inferior aspect of the right major fissure. Electronically Signed   By: Vinnie Langton M.D.   On: 12/09/2019 10:52   Other results:  Assessment, Plan, & Recommendations by Problem: Active Problems:   * No active hospital problems. *  Mr.Joslyn is a 39 yo M w/ PMH of bipolar disorder, ESRD, HTN who presents to [facility] with complaints of dyspnea. Presumed to be due to flash pulmonary edema from hypertensive emergency due to non-adherence to dialysis. Unfortunately patient left AMA prior to being evaluated.  Signed: Mosetta Anis, MD 12/09/2019, 12:30 PM  Pager: [phone]

## 2019-12-09 NOTE — ED Triage Notes (Signed)
Pt had full dialysis treatment yesterday.  Reports SOB and pink tinged sputum since this morning.

## 2019-12-09 NOTE — ED Triage Notes (Signed)
Dr. Langston Masker and Charge RN notified of Hgb 6.6.

## 2019-12-09 NOTE — ED Provider Notes (Signed)
[facility] EMERGENCY DEPARTMENT Provider Note   CSN: [id number] Arrival date & time: 12/09/19  0941     History Chief Complaint  Patient presents with  . Shortness of Breath    Samuel Webb is a 39 y.o. male.  Patient with history of end-stage renal disease on hemodialysis (R IJ Permacath), currently patient does not have established care with nephrologist nor dialysis center due to behavioral issues in the past, has been recently presenting to the [facility] ED for dialysis, last dialyzed on 4/19 and 4/23, hypertension (multiple medications), anemia (ofter receives transfusion during dialysis), bipolar disorder, chronic pain syndrome, history of pancreatitis and gastrointestinal bleeding --presents with worsening shortness of breath.  Most of the history obtained from friend at bedside as well as medical records as patient is unwilling to answer a lot of questions.  Patient presented to [facility] ED yesterday and was dialyzed.  Per ED note, it was requested that patient remain in hospital overnight for an additional dialysis session in the morning.  Patient refused.  He states that he woke up in the early hours this morning with shortness of breath, cough productive of pink blood-tinged sputum.  He states that his symptoms are consistent with previous episodes of fluid overload.  Friend reports that he was not dialyzed to his dry weight yesterday.  He also received a unit of blood during dialysis.  Patient denies any fevers.  He has a cough.  No recent Covid contacts.  No vomiting or diarrhea.  The onset of this condition was acute. The course is constant. Aggravating factors: none. Alleviating factors: none.          Past Medical History:  Diagnosis Date  . Anemia   . ESRD (end stage renal disease) ([place])   . Gastroparesis   . High cholesterol   . Hypertension   . Migraines   . Neuropathy     Patient Active Problem List   Diagnosis Date Noted   . ESRD (end stage renal disease) ([place]) 03/19/2018  . Pulmonary edema 03/11/2018  . Hypertensive emergency   . Mood disorder ([place])   . ESRD needing dialysis ([place]) 03/01/2018  . Hyperkalemia 03/01/2018  . Volume overload 03/01/2018  . Hypertension 03/01/2018  . Hypertensive urgency 03/01/2018  . Anemia 03/01/2018  . Acute pulmonary edema [facility])     Past Surgical History:  Procedure Laterality Date  . AV FISTULA PLACEMENT    . CHOLECYSTECTOMY         Family History  Problem Relation Age of Onset  . Migraines Mother     Social History   Tobacco Use  . Smoking status: Current Every Day Smoker    Packs/day: 0.50    Types: Cigarettes  . Smokeless tobacco: Current User  Substance Use Topics  . Alcohol use: No    Alcohol/week: 0.0 standard drinks  . Drug use: Yes    Types: Marijuana    Comment: occasionally    Home Medications Prior to Admission medications   Medication Sig Start Date End Date Taking? Authorizing Provider  amLODipine (NORVASC) 10 MG tablet Take 1 tablet (10 mg total) by mouth daily. Patient not taking: Reported on 03/01/2018 02/14/15   Lance Bosch, NP  aspirin-acetaminophen-caffeine (EXCEDRIN MIGRAINE) [phone] MG tablet Take 1 tablet by mouth every 6 (six) hours as needed for headache.    [provider]  atenolol (TENORMIN) 50 MG tablet Take 1 tablet (50 mg total) by mouth daily. Patient not taking:  Reported on 03/01/2018 02/14/15   Lance Bosch, NP  calcium carbonate (TUMS EX) 750 MG chewable tablet Chew 1 tablet by mouth 3 (three) times daily.     [provider]  clonazePAM (KLONOPIN) 1 MG tablet Take 1 mg by mouth 3 (three) times daily as needed for anxiety.  02/08/18   [provider]  cloNIDine (CATAPRES) 0.3 MG tablet Take 0.3 mg by mouth 3 (three) times daily. 03/25/17 03/19/18  [provider]  diphenhydrAMINE (BENADRYL) 25 mg capsule Take 50 mg by mouth at bedtime.    [provider]  furosemide  (LASIX) 40 MG tablet Take 40 mg by mouth 2 (two) times daily. 12/24/17   [provider]  hydrALAZINE (APRESOLINE) 100 MG tablet Take 100 mg by mouth 3 (three) times daily. 02/08/18   [provider]  labetalol (NORMODYNE) 200 MG tablet Take 800 mg by mouth 3 (three) times daily. 03/25/17 03/19/18  [provider]  NIFEdipine (PROCARDIA XL/ADALAT-CC) 90 MG 24 hr tablet Take 90 mg by mouth 2 (two) times daily. 03/25/17 03/19/18  [provider]  pantoprazole (PROTONIX) 40 MG tablet Take 40 mg by mouth daily. 01/08/18   [provider]  QUEtiapine (SEROQUEL) 100 MG tablet Take 100 mg by mouth daily. 02/05/18   [provider]  QUEtiapine (SEROQUEL) 300 MG tablet Take 300 mg by mouth at bedtime. 02/05/18   [provider]  sevelamer carbonate (RENVELA) 800 MG tablet Take 800-1,600 mg by mouth See admin instructions. Take 2 tablets three times daily with meals then take 1 tablet with snacks 02/05/18   [provider]    Allergies    Heparin, Hydrocodone, Hydrocodone-acetaminophen, and Sulfa antibiotics  Review of Systems   Review of Systems  Constitutional: Negative for fever.  HENT: Negative for rhinorrhea and sore throat.   Eyes: Negative for redness.  Respiratory: Positive for cough and shortness of breath.   Cardiovascular: Negative for chest pain.  Gastrointestinal: Negative for abdominal pain, diarrhea, nausea and vomiting.  Genitourinary: Negative for dysuria.  Musculoskeletal: Negative for myalgias.  Skin: Negative for rash.  Neurological: Negative for headaches.    Physical Exam Updated Vital Signs BP (!) 229/110 (BP Location: Right Arm)   Pulse 67   Temp 98.9 F (37.2 C) (Oral)   Resp 18   Ht 6' (1.829 m)   Wt 86.2 kg   SpO2 100%   BMI 25.77 kg/m   Physical Exam Vitals and nursing note reviewed.  Constitutional:      Appearance: He is well-developed.  HENT:     Head: Normocephalic and atraumatic.  Eyes:      General:        Right eye: No discharge.        Left eye: No discharge.     Conjunctiva/sclera: Conjunctivae normal.  Neck:     Vascular: JVD present.  Cardiovascular:     Rate and Rhythm: Normal rate and regular rhythm.     Heart sounds: Normal heart sounds.  Pulmonary:     Effort: Pulmonary effort is normal.     Breath sounds: Examination of the right-middle field reveals rales. Examination of the left-middle field reveals rales. Examination of the right-lower field reveals rales. Examination of the left-lower field reveals rales. Rales present. No decreased breath sounds.     Comments: Patient sitting upright, heavy but unlabored breathing Abdominal:     Palpations: Abdomen is soft.     Tenderness: There is no abdominal tenderness.  Musculoskeletal:  Cervical back: Normal range of motion and neck supple.  Skin:    General: Skin is warm and dry.  Neurological:     Mental Status: He is alert.     ED Results / Procedures / Treatments   Labs (all labs ordered are listed, but only abnormal results are displayed) Labs Reviewed  BASIC METABOLIC PANEL - Abnormal; Notable for the following components:      Result Value   Potassium 6.0 (*)    Glucose, Bld 101 (*)    BUN 56 (*)    Creatinine, Ser 8.07 (*)    GFR calc non Af Amer 8 (*)    GFR calc Af Amer 9 (*)    All other components within normal limits  CBC - Abnormal; Notable for the following components:   WBC 10.7 (*)    RBC 2.17 (*)    Hemoglobin 6.6 (*)    HCT 20.9 (*)    RDW 16.0 (*)    All other components within normal limits  TROPONIN I (HIGH SENSITIVITY) - Abnormal; Notable for the following components:   Troponin I (High Sensitivity) 37 (*)    All other components within normal limits  RESPIRATORY PANEL BY RT PCR (FLU A&B, COVID)  TYPE AND SCREEN  TROPONIN I (HIGH SENSITIVITY)    EKG EKG Interpretation  Date/Time:  Saturday December 09 2019 09:43:27 EDT Ventricular Rate:  67 PR Interval:  166 QRS  Duration: 94 QT Interval:  440 QTC Calculation: 464 R Axis:   97 Text Interpretation: Normal sinus rhythm Rightward axis Incomplete right bundle branch block Borderline ECG No significant change since prior 8/19 Confirmed by Aletta Edouard [phone]) on 12/09/2019 11:06:49 AM   Radiology DG Chest 2 View  Result Date: 12/09/2019 CLINICAL DATA:  39 year old male with history of shortness of breath since 4 a.m. today. EXAM: CHEST - 2 VIEW COMPARISON:  Chest x-ray 11/26/2019. FINDINGS: Right IJ PermCath with tip terminating in the right atrium. Lung volumes are normal. Small right pleural effusion, much of which is tracking in the inferior aspect of the right major fissure. There is cephalization of the pulmonary vasculature and slight indistinctness of the interstitial markings suggestive of mild pulmonary edema. No left pleural effusion. Mild cardiomegaly. Upper mediastinal contours are within normal limits. IMPRESSION: 1. The appearance the chest suggests mild congestive heart failure. 2. Small right pleural effusion, much of which tracks in the inferior aspect of the right major fissure. Electronically Signed   By: Vinnie Langton M.D.   On: 12/09/2019 10:52    Procedures Procedures (including critical care time)  Medications Ordered in ED Medications  sodium chloride flush (NS) 0.9 % injection 3 mL (3 mLs Intravenous Not Given 12/09/19 1122)  nitroGLYCERIN 50 mg in dextrose 5 % 250 mL (0.2 mg/mL) infusion (0 mcg/min Intravenous Not Given 12/09/19 1144)    ED Course  I have reviewed the triage vital signs and the nursing notes.  Pertinent labs & imaging results that were available during my care of the patient were reviewed by me and considered in my medical decision making (see chart for details).  Patient seen and examined.  Reviewed recent notes from [facility].  Blood pressure on arrival was 229/110.  On recheck 185/133.  Given shortness of breath and concern for pulmonary  edema, would like to keep systolic blood pressure less than 180.  Nitroglycerin drip ordered in case we need to more tightly control blood pressure.  Will discuss with nephrology.  Patient  was placed on oxygen for comfort, no reported hypoxia.  Vital signs reviewed and are as follows: BP (!) 185/133 (BP Location: Right Wrist)   Pulse (!) 59   Temp 98.1 F (36.7 C) (Oral)   Resp 13   Ht 6' (1.829 m)   Wt 86.2 kg   SpO2 100%   BMI 25.77 kg/m   12:05 PM I discussed case with Dr. Jonnie Finner of nephrology, they will consult.  Spoke with IMTS who will see for admission. Pt refusing COVID testing. He has had recent tests -- will defer to IMTS.   12:54 PM it was reported to me that once consultants came to see the patient, they asked for a Covid swab.  Patient became angry about this and decided that he would leave [place].  He then left the Emergency Department. I did not get a chance to see the patient during this interaction.   CRITICAL CARE Performed by: Carlisle Cater PA-C Total critical care time: 40 minutes Critical care time was exclusive of separately billable procedures and treating other patients. Critical care was necessary to treat or prevent imminent or life-threatening deterioration. Critical care was time spent personally by me on the following activities: development of treatment plan with patient and/or surrogate as well as nursing, discussions with consultants, evaluation of patient's response to treatment, examination of patient, obtaining history from patient or surrogate, ordering and performing treatments and interventions, ordering and review of laboratory studies, ordering and review of radiographic studies, pulse oximetry and re-evaluation of patient's condition.    Clinical Course as of Dec 09 1203  Sat Apr 24, 442  [id number] 39 year old male with end-stage renal disease here with increased shortness of breath and elevated blood pressure.  He was last dialyzed  yesterday.  He does not have a steady dialysis center and goes to [facility] 2 times a week to get dialysis from the ED.  Blood pressure still elevated heart rate in the 50s.  Will review case with nephrology to see if we can get him dialyzed today.   [MB]    Clinical Course User Index [MB] Hayden Rasmussen, MD   MDM Rules/Calculators/A&P                      Admit.    Final Clinical Impression(s) / ED Diagnoses Final diagnoses:  Hypertensive emergency  Acute pulmonary edema ([place])  ESRD (end stage renal disease) on dialysis ([place])  Anemia due to chronic kidney disease, on chronic dialysis [facility])  Hyperkalemia    Rx / DC Orders ED Discharge Orders    None       Carlisle Cater, PA-C 12/09/19 1255    Hayden Rasmussen, MD 12/09/19 2229

## 2019-12-09 NOTE — ED Notes (Signed)
Pt was being aggressive and confrontational, then escorted out by security. This nurse then called the pt to be sure he seeks treatment to get his IV removed. Did not feel comfortable with the level of aggression of the pt and being in the same room.

## 2019-12-09 NOTE — ED Notes (Signed)
Pt refused nitro medication because it gives him headaches.

## 2019-12-09 NOTE — ED Notes (Signed)
This nurse heard "get the f**k out of my face ive been dealing with this for a long time." doctors left. This nurse went in to sign the pt out AMA. Pt stated "Im not signing anything im getting out of here." pt was packing up all his belongings. Pt escorted out by security.

## 2019-12-09 NOTE — ED Notes (Signed)
New pressures communicated to provider

## 2019-12-09 NOTE — ED Notes (Signed)
Pt refusing covid swab. "is that the one that goes all the way up my nose? I dont want it, ill go somewhere else."

## 2019-12-15 MED ORDER — LABETALOL HCL 200 MG PO TABS
800.00 | ORAL_TABLET | ORAL | Status: DC
Start: 2019-12-14 — End: 2019-12-15

## 2019-12-15 MED ORDER — NIFEDIPINE ER OSMOTIC RELEASE 30 MG PO TB24
90.00 | ORAL_TABLET | ORAL | Status: DC
Start: 2019-12-15 — End: 2019-12-15

## 2019-12-15 MED ORDER — SEVELAMER CARBONATE 800 MG PO TABS
1600.00 | ORAL_TABLET | ORAL | Status: DC
Start: 2019-12-14 — End: 2019-12-15

## 2019-12-15 MED ORDER — GENERIC EXTERNAL MEDICATION
5.00 | Status: DC
Start: ? — End: 2019-12-15

## 2019-12-15 MED ORDER — PREDNISONE 5 MG PO TABS
5.00 | ORAL_TABLET | ORAL | Status: DC
Start: 2019-12-15 — End: 2019-12-15

## 2019-12-15 MED ORDER — PANTOPRAZOLE SODIUM 40 MG PO TBEC
40.00 | DELAYED_RELEASE_TABLET | ORAL | Status: DC
Start: 2019-12-15 — End: 2019-12-15

## 2019-12-15 MED ORDER — HYDRALAZINE HCL 50 MG PO TABS
100.00 | ORAL_TABLET | ORAL | Status: DC
Start: 2019-12-14 — End: 2019-12-15

## 2019-12-15 MED ORDER — CLONIDINE HCL 0.1 MG PO TABS
0.10 | ORAL_TABLET | ORAL | Status: DC
Start: 2019-12-14 — End: 2019-12-15

## 2019-12-15 MED ORDER — ASPIRIN 81 MG PO TBEC
81.00 | DELAYED_RELEASE_TABLET | ORAL | Status: DC
Start: 2019-12-15 — End: 2019-12-15

## 2019-12-15 MED ORDER — OXYCODONE HCL 5 MG PO TABS
10.00 | ORAL_TABLET | ORAL | Status: DC
Start: ? — End: 2019-12-15

## 2019-12-15 MED ORDER — FLUOXETINE HCL 10 MG PO CAPS
10.00 | ORAL_CAPSULE | ORAL | Status: DC
Start: 2019-12-15 — End: 2019-12-15

## 2019-12-15 MED ORDER — CALCIUM CARBONATE 1250 (500 CA) MG PO CHEW
500.00 | CHEWABLE_TABLET | ORAL | Status: DC
Start: ? — End: 2019-12-15

## 2019-12-15 MED ORDER — ATORVASTATIN CALCIUM 10 MG PO TABS
20.00 | ORAL_TABLET | ORAL | Status: DC
Start: 2019-12-14 — End: 2019-12-15

## 2022-01-15 DEATH — deceased
